# Patient Record
Sex: Female | Born: 1975 | Race: White | Hispanic: No | Marital: Married | Smoking: Never smoker
Health system: Southern US, Community
[De-identification: ages and names within clinical notes are randomized; demographics above are authoritative.]

## PROBLEM LIST (undated history)

## (undated) DIAGNOSIS — R5383 Other fatigue: Secondary | ICD-10-CM

## (undated) DIAGNOSIS — N390 Urinary tract infection, site not specified: Secondary | ICD-10-CM

## (undated) DIAGNOSIS — G43909 Migraine, unspecified, not intractable, without status migrainosus: Secondary | ICD-10-CM

## (undated) DIAGNOSIS — F329 Major depressive disorder, single episode, unspecified: Secondary | ICD-10-CM

## (undated) DIAGNOSIS — N920 Excessive and frequent menstruation with regular cycle: Secondary | ICD-10-CM

## (undated) DIAGNOSIS — F32A Depression, unspecified: Secondary | ICD-10-CM

## (undated) DIAGNOSIS — E559 Vitamin D deficiency, unspecified: Secondary | ICD-10-CM

## (undated) DIAGNOSIS — F419 Anxiety disorder, unspecified: Secondary | ICD-10-CM

## (undated) HISTORY — DX: Depression, unspecified: F32.A

## (undated) HISTORY — DX: Anxiety disorder, unspecified: F41.9

## (undated) HISTORY — DX: Major depressive disorder, single episode, unspecified: F32.9

## (undated) HISTORY — DX: Vitamin D deficiency, unspecified: E55.9

## (undated) HISTORY — PX: WISDOM TOOTH EXTRACTION: SHX21

## (undated) HISTORY — PX: TONSILLECTOMY: SUR1361

## (undated) HISTORY — DX: Excessive and frequent menstruation with regular cycle: N92.0

## (undated) HISTORY — DX: Other fatigue: R53.83

## (undated) HISTORY — DX: Migraine, unspecified, not intractable, without status migrainosus: G43.909

## (undated) HISTORY — DX: Urinary tract infection, site not specified: N39.0

---

## 2012-02-13 ENCOUNTER — Ambulatory Visit: Payer: Self-pay

## 2014-09-28 LAB — HM PAP SMEAR: HM Pap smear: NEGATIVE

## 2015-09-29 ENCOUNTER — Encounter: Payer: Self-pay | Admitting: *Deleted

## 2015-10-04 ENCOUNTER — Ambulatory Visit (INDEPENDENT_AMBULATORY_CARE_PROVIDER_SITE_OTHER): Payer: 59 | Admitting: Obstetrics and Gynecology

## 2015-10-04 ENCOUNTER — Encounter: Payer: Self-pay | Admitting: Obstetrics and Gynecology

## 2015-10-04 VITALS — BP 108/66 | HR 82 | Ht 65.0 in | Wt 157.3 lb

## 2015-10-04 DIAGNOSIS — Z01419 Encounter for gynecological examination (general) (routine) without abnormal findings: Secondary | ICD-10-CM | POA: Diagnosis not present

## 2015-10-04 NOTE — Patient Instructions (Signed)
  Place annual gynecologic exam patient instructions here.  Thank you for enrolling in [facility]. Please follow the instructions below to securely access your online medical record. MyChart allows you to send messages to your doctor, view your test results, manage appointments, and more.   How Do I Sign Up? 1. In your Internet browser, go to [employer] and enter [email or web address]. 2. Click on the Sign Up Now link in the Sign In box. You will see the New Member Sign Up page. 3. Enter your MyChart Access Code exactly as it appears below. You will not need to use this code after you've completed the sign-up process. If you do not sign up before the expiration date, you must request a new code.  MyChart Access Code: [id number] Expires: 10/18/2015  8:57 AM  4. Enter your Social Security Number ([id number]) and Date of Birth (mm/dd/yyyy) as indicated and click Submit. You will be taken to the next sign-up page. 5. Create a MyChart ID. This will be your MyChart login ID and cannot be changed, so think of one that is secure and easy to remember. 6. Create a MyChart password. You can change your password at any time. 7. Enter your Password Reset Question and Answer. This can be used at a later time if you forget your password.  8. Enter your e-mail address. You will receive e-mail notification when new information is available in [facility]. 9. Click Sign Up. You can now view your medical record.   Additional Information Remember, MyChart is NOT to be used for urgent needs. For medical emergencies, dial 911.

## 2015-10-04 NOTE — Progress Notes (Signed)
Subjective:   Marcia Green is a 39 y.o. G68P0 Caucasian female here for a routine well-woman exam.  No LMP recorded. Patient is not currently having periods (Reason: IUD).    Current complaints: none PCP: me       does desire labs  Social History: Sexual: heterosexual Marital Status: co-habitating Living situation: with family Occupation: Oncologist Tobacco/alcohol: no tobacco use Illicit drugs: no history of illicit drug use  The following portions of the patient's history were reviewed and updated as appropriate: allergies, current medications, past family history, past medical history, past social history, past surgical history and problem list.  Past Medical History Past Medical History  Diagnosis Date  . Anxiety   . Depression   . UTI (lower urinary tract infection)   . Heavy periods   . Migraines   . Vitamin D deficiency   . Fatigue     Past Surgical History History reviewed. No pertinent past surgical history.  Gynecologic History G2P0  No LMP recorded. Patient is not currently having periods (Reason: IUD). Contraception: IUD Last Pap: 2015. Results were: normal  Obstetric History OB History  Gravida Para Term Preterm AB SAB TAB Ectopic Multiple Living  2         2    # Outcome Date GA Lbr Len/2nd Weight Sex Delivery Anes PTL Lv  2 Gravida 1996    M Vag-Spont   Y  1 Gravida     M Vag-Spont   Y      Current Medications Current Outpatient Prescriptions on File Prior to Visit  Medication Sig Dispense Refill  . ergocalciferol (VITAMIN D2) 50000 UNITS capsule Take 50,000 Units by mouth once a week.    Marland Kitchen levonorgestrel (MIRENA) 20 MCG/24HR IUD 1 each by Intrauterine route once.     No current facility-administered medications on file prior to visit.    Review of Systems Patient denies any headaches, blurred vision, shortness of breath, chest pain, abdominal pain, problems with bowel movements, urination, or intercourse.  Objective:  BP 108/66 mmHg  Pulse  82  Ht 5\' 5"  (1.651 m)  Wt 157 lb 4.8 oz (71.351 kg)  BMI 26.18 kg/m2 Physical Exam  General:  Well developed, well nourished, no acute distress. She is alert and oriented x3. Skin:  Warm and dry Neck:  Midline trachea, no thyromegaly or nodules Cardiovascular: Regular rate and rhythm, no murmur heard Lungs:  Effort normal, all lung fields clear to auscultation bilaterally Breasts:  No dominant palpable mass, retraction, or nipple discharge Abdomen:  Soft, non tender, no hepatosplenomegaly or masses Pelvic:  External genitalia is normal in appearance.  The vagina is normal in appearance. The cervix is bulbous, no CMT. IUD string noted Thin prep pap is not done  . Uterus is felt to be normal size, shape, and contour.  No adnexal masses or tenderness noted. Extremities:  No swelling or varicosities noted Psych:  She has a normal mood and affect  Assessment:   Healthy well-woman exam Vitamin D def IUD check  Plan:  Labs obtained F/U 1 year for AE, or sooner if needed   Faye Strohman Rockney Ghee, CNM

## 2015-10-05 ENCOUNTER — Other Ambulatory Visit: Payer: Self-pay | Admitting: Obstetrics and Gynecology

## 2015-10-05 DIAGNOSIS — E559 Vitamin D deficiency, unspecified: Secondary | ICD-10-CM | POA: Insufficient documentation

## 2015-10-05 LAB — COMPREHENSIVE METABOLIC PANEL
ALK PHOS: 58 IU/L (ref 39–117)
ALT: 15 IU/L (ref 0–32)
AST: 17 IU/L (ref 0–40)
Albumin/Globulin Ratio: 1.9 (ref 1.1–2.5)
Albumin: 4.8 g/dL (ref 3.5–5.5)
BUN/Creatinine Ratio: 13 (ref 8–20)
BUN: 9 mg/dL (ref 6–20)
Bilirubin Total: 0.7 mg/dL (ref 0.0–1.2)
CALCIUM: 9.5 mg/dL (ref 8.7–10.2)
CO2: 22 mmol/L (ref 18–29)
CREATININE: 0.67 mg/dL (ref 0.57–1.00)
Chloride: 100 mmol/L (ref 96–106)
GFR calc Af Amer: 128 mL/min/{1.73_m2} (ref >59)
GFR, EST NON AFRICAN AMERICAN: 111 mL/min/{1.73_m2} (ref >59)
GLOBULIN, TOTAL: 2.5 g/dL (ref 1.5–4.5)
GLUCOSE: 88 mg/dL (ref 65–99)
Potassium: 3.7 mmol/L (ref 3.5–5.2)
SODIUM: 138 mmol/L (ref 134–144)
Total Protein: 7.3 g/dL (ref 6.0–8.5)

## 2015-10-05 LAB — LIPID PANEL
CHOLESTEROL TOTAL: 170 mg/dL (ref 100–199)
Chol/HDL Ratio: 3 ratio units (ref 0.0–4.4)
HDL: 56 mg/dL (ref >39)
LDL CALC: 101 mg/dL — AB (ref 0–99)
Triglycerides: 63 mg/dL (ref 0–149)
VLDL CHOLESTEROL CAL: 13 mg/dL (ref 5–40)

## 2015-10-05 LAB — VITAMIN D 25 HYDROXY (VIT D DEFICIENCY, FRACTURES): VIT D 25 HYDROXY: 26.1 ng/mL — AB (ref 30.0–100.0)

## 2015-10-05 MED ORDER — ERGOCALCIFEROL 1.25 MG (50000 UT) PO CAPS: 50000.0000 [IU] | ORAL_CAPSULE | ORAL | Status: DC

## 2016-10-04 ENCOUNTER — Encounter: Payer: 59 | Admitting: Obstetrics and Gynecology

## 2016-11-30 ENCOUNTER — Encounter: Payer: Self-pay | Admitting: Obstetrics and Gynecology

## 2016-11-30 ENCOUNTER — Ambulatory Visit (INDEPENDENT_AMBULATORY_CARE_PROVIDER_SITE_OTHER): Payer: 59 | Admitting: Obstetrics and Gynecology

## 2016-11-30 VITALS — BP 112/80 | HR 78 | Ht 65.0 in | Wt 156.4 lb

## 2016-11-30 DIAGNOSIS — Z30431 Encounter for routine checking of intrauterine contraceptive device: Secondary | ICD-10-CM

## 2016-11-30 DIAGNOSIS — Z01419 Encounter for gynecological examination (general) (routine) without abnormal findings: Secondary | ICD-10-CM

## 2016-11-30 NOTE — Progress Notes (Signed)
Subjective:   Marcia Green is a 41 y.o. G32P0 Caucasian female here for a routine well-woman exam.  No LMP recorded. Patient is not currently having periods (Reason: IUD).    Current complaints: none, amenorrhea secondary to IUD PCP: none       does desire labs  Social History: Sexual: heterosexual Marital Status: married Living situation: with family Occupation: [employer] Tobacco/alcohol: no tobacco use Illicit drugs: no history of illicit drug use  The following portions of the patient's history were reviewed and updated as appropriate: allergies, current medications, past family history, past medical history, past social history, past surgical history and problem list.  Past Medical History Past Medical History:  Diagnosis Date  . Anxiety   . Depression   . Fatigue   . Heavy periods   . Migraines   . UTI (lower urinary tract infection)   . Vitamin D deficiency     Past Surgical History History reviewed. No pertinent surgical history.  Gynecologic History G2P0  No LMP recorded. Patient is not currently having periods (Reason: IUD). Contraception: IUD Last Pap: 2015. Results were: normal   Obstetric History OB History  Gravida Para Term Preterm AB Living  2         2  SAB TAB Ectopic Multiple Live Births          2    # Outcome Date GA Lbr Len/2nd Weight Sex Delivery Anes PTL Lv  2 Gravida 1996    M Vag-Spont   LIV  1 Gravida     M Vag-Spont   LIV      Current Medications Current Outpatient Prescriptions on File Prior to Visit  Medication Sig Dispense Refill  . ergocalciferol (VITAMIN D2) 50000 UNITS capsule Take 1 capsule (50,000 Units total) by mouth once a week. 30 capsule 1  . levonorgestrel (MIRENA) 20 MCG/24HR IUD 1 each by Intrauterine route once.     No current facility-administered medications on file prior to visit.     Review of Systems Patient denies any headaches, blurred vision, shortness of breath, chest pain, abdominal pain, problems with  bowel movements, urination, or intercourse.  Objective:  BP 112/80   Pulse 78   Ht 5\' 5"  (1.651 m)   Wt 156 lb 6.4 oz (70.9 kg)   BMI 26.03 kg/m  Physical Exam  General:  Well developed, well nourished, no acute distress. She is alert and oriented x3. Skin:  Warm and dry Neck:  Midline trachea, no thyromegaly or nodules Cardiovascular: Regular rate and rhythm, no murmur heard Lungs:  Effort normal, all lung fields clear to auscultation bilaterally Breasts:  No dominant palpable mass, retraction, or nipple discharge Abdomen:  Soft, non tender, no hepatosplenomegaly or masses Pelvic:  External genitalia is normal in appearance.  The vagina is normal in appearance. The cervix is bulbous, no CMT. IUD string noted  Thin prep pap is not done. Uterus is felt to be normal size, shape, and contour.  No adnexal masses or tenderness noted. Extremities:  No swelling or varicosities noted Psych:  She has a normal mood and affect  Assessment:   Healthy well-woman exam IUD check H/O vit D Deficiency  Plan:  Labs obtained F/U 1 year for AE, or sooner if needed Mammogram ordered  Kepler Mccabe Rockney Ghee, CNM

## 2016-11-30 NOTE — Patient Instructions (Signed)
 Preventive Care 18-39 Years, Female Preventive care refers to lifestyle choices and visits with your health care provider that can promote health and wellness. What does preventive care include?  A yearly physical exam. This is also called an annual well check.  Dental exams once or twice a year.  Routine eye exams. Ask your health care provider how often you should have your eyes checked.  Personal lifestyle choices, including:  Daily care of your teeth and gums.  Regular physical activity.  Eating a healthy diet.  Avoiding tobacco and drug use.  Limiting alcohol use.  Practicing safe sex.  Taking vitamin and mineral supplements as recommended by your health care provider. What happens during an annual well check? The services and screenings done by your health care provider during your annual well check will depend on your age, overall health, lifestyle risk factors, and family history of disease. Counseling  Your health care provider may ask you questions about your:  Alcohol use.  Tobacco use.  Drug use.  Emotional well-being.  Home and relationship well-being.  Sexual activity.  Eating habits.  Work and work environment.  Method of birth control.  Menstrual cycle.  Pregnancy history. Screening  You may have the following tests or measurements:  Height, weight, and BMI.  Diabetes screening. This is done by checking your blood sugar (glucose) after you have not eaten for a while (fasting).  Blood pressure.  Lipid and cholesterol levels. These may be checked every 5 years starting at age 20.  Skin check.  Hepatitis C blood test.  Hepatitis B blood test.  Sexually transmitted disease (STD) testing.  BRCA-related cancer screening. This may be done if you have a family history of breast, ovarian, tubal, or peritoneal cancers.  Pelvic exam and Pap test. This may be done every 3 years starting at age 21. Starting at age 30, this may be done  every 5 years if you have a Pap test in combination with an HPV test. Discuss your test results, treatment options, and if necessary, the need for more tests with your health care provider. Vaccines  Your health care provider may recommend certain vaccines, such as:  Influenza vaccine. This is recommended every year.  Tetanus, diphtheria, and acellular pertussis (Tdap, Td) vaccine. You may need a Td booster every 10 years.  Varicella vaccine. You may need this if you have not been vaccinated.  HPV vaccine. If you are 26 or younger, you may need three doses over 6 months.  Measles, mumps, and rubella (MMR) vaccine. You may need at least one dose of MMR. You may also need a second dose.  Pneumococcal 13-valent conjugate (PCV13) vaccine. You may need this if you have certain conditions and were not previously vaccinated.  Pneumococcal polysaccharide (PPSV23) vaccine. You may need one or two doses if you smoke cigarettes or if you have certain conditions.  Meningococcal vaccine. One dose is recommended if you are age 19-21 years and a first-year college student living in a residence hall, or if you have one of several medical conditions. You may also need additional booster doses.  Hepatitis A vaccine. You may need this if you have certain conditions or if you travel or work in places where you may be exposed to hepatitis A.  Hepatitis B vaccine. You may need this if you have certain conditions or if you travel or work in places where you may be exposed to hepatitis B.  Haemophilus influenzae type b (Hib) vaccine. You may need   this if you have certain risk factors. Talk to your health care provider about which screenings and vaccines you need and how often you need them. This information is not intended to replace advice given to you by your health care provider. Make sure you discuss any questions you have with your health care provider. Document Released: 11/27/2001 Document Revised:  06/20/2016 Document Reviewed: 08/02/2015 Elsevier Interactive Patient Education  2017 [employer]  

## 2016-12-01 LAB — VITAMIN D 25 HYDROXY (VIT D DEFICIENCY, FRACTURES): VIT D 25 HYDROXY: 26.1 ng/mL — AB (ref 30.0–100.0)

## 2016-12-01 LAB — COMPREHENSIVE METABOLIC PANEL
ALK PHOS: 58 IU/L (ref 39–117)
ALT: 13 IU/L (ref 0–32)
AST: 18 IU/L (ref 0–40)
Albumin/Globulin Ratio: 1.7 (ref 1.2–2.2)
Albumin: 4.3 g/dL (ref 3.5–5.5)
BUN/Creatinine Ratio: 15 (ref 9–23)
BUN: 9 mg/dL (ref 6–24)
Bilirubin Total: 0.5 mg/dL (ref 0.0–1.2)
CALCIUM: 9.7 mg/dL (ref 8.7–10.2)
CO2: 19 mmol/L (ref 18–29)
CREATININE: 0.59 mg/dL (ref 0.57–1.00)
Chloride: 102 mmol/L (ref 96–106)
GFR calc Af Amer: 133 mL/min/{1.73_m2} (ref >59)
GFR, EST NON AFRICAN AMERICAN: 115 mL/min/{1.73_m2} (ref >59)
GLOBULIN, TOTAL: 2.5 g/dL (ref 1.5–4.5)
GLUCOSE: 89 mg/dL (ref 65–99)
Potassium: 4.6 mmol/L (ref 3.5–5.2)
SODIUM: 141 mmol/L (ref 134–144)
Total Protein: 6.8 g/dL (ref 6.0–8.5)

## 2016-12-01 LAB — LIPID PANEL
CHOLESTEROL TOTAL: 152 mg/dL (ref 100–199)
Chol/HDL Ratio: 2.9 ratio units (ref 0.0–4.4)
HDL: 52 mg/dL (ref >39)
LDL CALC: 89 mg/dL (ref 0–99)
Triglycerides: 53 mg/dL (ref 0–149)
VLDL Cholesterol Cal: 11 mg/dL (ref 5–40)

## 2016-12-04 ENCOUNTER — Other Ambulatory Visit: Payer: Self-pay | Admitting: Obstetrics and Gynecology

## 2016-12-04 MED ORDER — ERGOCALCIFEROL 1.25 MG (50000 UT) PO CAPS: 50000.0000 [IU] | ORAL_CAPSULE | ORAL | 1 refills | Status: DC

## 2016-12-24 ENCOUNTER — Ambulatory Visit
Admission: RE | Admit: 2016-12-24 | Discharge: 2016-12-24 | Disposition: A | Discharge status: Home / Self Care | Payer: 59 | Source: Ambulatory Visit | Attending: Obstetrics and Gynecology | Admitting: Obstetrics and Gynecology

## 2016-12-24 DIAGNOSIS — Z1231 Encounter for screening mammogram for malignant neoplasm of breast: Secondary | ICD-10-CM | POA: Diagnosis not present

## 2016-12-24 DIAGNOSIS — Z01419 Encounter for gynecological examination (general) (routine) without abnormal findings: Secondary | ICD-10-CM | POA: Diagnosis present

## 2017-06-07 ENCOUNTER — Encounter: Payer: Self-pay | Admitting: Obstetrics and Gynecology

## 2017-06-10 ENCOUNTER — Other Ambulatory Visit: Payer: Self-pay | Admitting: *Deleted

## 2017-06-10 MED ORDER — SCOPOLAMINE 1 MG/3DAYS TD PT72: 1.0000 | MEDICATED_PATCH | TRANSDERMAL | 12 refills | Status: DC

## 2017-06-20 ENCOUNTER — Encounter: Payer: Self-pay | Admitting: Obstetrics and Gynecology

## 2017-06-21 ENCOUNTER — Other Ambulatory Visit: Payer: Self-pay | Admitting: Obstetrics and Gynecology

## 2017-06-21 MED ORDER — CIPROFLOXACIN HCL 500 MG PO TABS
500.0000 mg | ORAL_TABLET | Freq: Two times a day (BID) | ORAL | 1 refills | Status: DC
Start: 2017-06-21 — End: 2017-12-09

## 2017-06-21 MED ORDER — MECLIZINE HCL 32 MG PO TABS
32.0000 mg | ORAL_TABLET | Freq: Three times a day (TID) | ORAL | 1 refills | Status: DC | PRN
Start: 2017-06-21 — End: 2018-12-10

## 2017-12-06 ENCOUNTER — Encounter: Payer: 59 | Admitting: Obstetrics and Gynecology

## 2017-12-09 ENCOUNTER — Ambulatory Visit (INDEPENDENT_AMBULATORY_CARE_PROVIDER_SITE_OTHER): Payer: Managed Care, Other (non HMO) | Admitting: Certified Nurse Midwife

## 2017-12-09 ENCOUNTER — Encounter: Payer: Self-pay | Admitting: Certified Nurse Midwife

## 2017-12-09 VITALS — BP 113/73 | HR 76 | Ht 65.0 in | Wt 172.1 lb

## 2017-12-09 DIAGNOSIS — N898 Other specified noninflammatory disorders of vagina: Secondary | ICD-10-CM | POA: Diagnosis not present

## 2017-12-09 DIAGNOSIS — Z0000 Encounter for general adult medical examination without abnormal findings: Secondary | ICD-10-CM | POA: Diagnosis not present

## 2017-12-09 MED ORDER — CYANOCOBALAMIN 1000 MCG/ML IJ SOLN: 1000.0000 ug | Freq: Once | INTRAMUSCULAR | 0 refills | Status: AC

## 2017-12-09 MED ORDER — CYANOCOBALAMIN 1000 MCG/ML IJ SOLN
1000.0000 ug | Freq: Once | INTRAMUSCULAR | Status: AC
  Administered 2017-12-09: 1000 ug via INTRAMUSCULAR

## 2017-12-09 MED ORDER — CYANOCOBALAMIN 1000 MCG/ML IJ SOLN: 1000.0000 ug | Freq: Once | INTRAMUSCULAR | Status: DC

## 2017-12-09 MED ORDER — PHENTERMINE HCL 37.5 MG PO CAPS: 37.5000 mg | ORAL_CAPSULE | ORAL | 0 refills | Status: DC

## 2017-12-09 NOTE — Progress Notes (Signed)
Pt is here for her annual exam. LPS 2018 WNL. Would like to discuss weight loss.

## 2017-12-09 NOTE — Patient Instructions (Addendum)
Preventive Care 40-64 Years, Female Preventive care refers to lifestyle choices and visits with your health care provider that can promote health and wellness. What does preventive care include?  A yearly physical exam. This is also called an annual well check.  Dental exams once or twice a year.  Routine eye exams. Ask your health care provider how often you should have your eyes checked.  Personal lifestyle choices, including: ? Daily care of your teeth and gums. ? Regular physical activity. ? Eating a healthy diet. ? Avoiding tobacco and drug use. ? Limiting alcohol use. ? Practicing safe sex. ? Taking low-dose aspirin daily starting at age 58. ? Taking vitamin and mineral supplements as recommended by your health care provider. What happens during an annual well check? The services and screenings done by your health care provider during your annual well check will depend on your age, overall health, lifestyle risk factors, and family history of disease. Counseling Your health care provider may ask you questions about your:  Alcohol use.  Tobacco use.  Drug use.  Emotional well-being.  Home and relationship well-being.  Sexual activity.  Eating habits.  Work and work Statistician.  Method of birth control.  Menstrual cycle.  Pregnancy history.  Screening You may have the following tests or measurements:  Height, weight, and BMI.  Blood pressure.  Lipid and cholesterol levels. These may be checked every 5 years, or more frequently if you are over 81 years old.  Skin check.  Lung cancer screening. You may have this screening every year starting at age 78 if you have a 30-pack-year history of smoking and currently smoke or have quit within the past 15 years.  Fecal occult blood test (FOBT) of the stool. You may have this test every year starting at age 65.  Flexible sigmoidoscopy or colonoscopy. You may have a sigmoidoscopy every 5 years or a colonoscopy  every 10 years starting at age 30.  Hepatitis C blood test.  Hepatitis B blood test.  Sexually transmitted disease (STD) testing.  Diabetes screening. This is done by checking your blood sugar (glucose) after you have not eaten for a while (fasting). You may have this done every 1-3 years.  Mammogram. This may be done every 1-2 years. Talk to your health care provider about when you should start having regular mammograms. This may depend on whether you have a family history of breast cancer.  BRCA-related cancer screening. This may be done if you have a family history of breast, ovarian, tubal, or peritoneal cancers.  Pelvic exam and Pap test. This may be done every 3 years starting at age 80. Starting at age 36, this may be done every 5 years if you have a Pap test in combination with an HPV test.  Bone density scan. This is done to screen for osteoporosis. You may have this scan if you are at high risk for osteoporosis.  Discuss your test results, treatment options, and if necessary, the need for more tests with your health care provider. Vaccines Your health care provider may recommend certain vaccines, such as:  Influenza vaccine. This is recommended every year.  Tetanus, diphtheria, and acellular pertussis (Tdap, Td) vaccine. You may need a Td booster every 10 years.  Varicella vaccine. You may need this if you have not been vaccinated.  Zoster vaccine. You may need this after age 5.  Measles, mumps, and rubella (MMR) vaccine. You may need at least one dose of MMR if you were born in  1957 or later. You may also need a second dose.  Pneumococcal 13-valent conjugate (PCV13) vaccine. You may need this if you have certain conditions and were not previously vaccinated.  Pneumococcal polysaccharide (PPSV23) vaccine. You may need one or two doses if you smoke cigarettes or if you have certain conditions.  Meningococcal vaccine. You may need this if you have certain  conditions.  Hepatitis A vaccine. You may need this if you have certain conditions or if you travel or work in places where you may be exposed to hepatitis A.  Hepatitis B vaccine. You may need this if you have certain conditions or if you travel or work in places where you may be exposed to hepatitis B.  Haemophilus influenzae type b (Hib) vaccine. You may need this if you have certain conditions.  Talk to your health care provider about which screenings and vaccines you need and how often you need them. This information is not intended to replace advice given to you by your health care provider. Make sure you discuss any questions you have with your health care provider. Document Released: 10/28/2015 Document Revised: 06/20/2016 Document Reviewed: 08/02/2015 Elsevier Interactive Patient Education  2018 [place] for [employer] Calories are units of energy. Your body needs a certain amount of calories from food to keep you going throughout the day. When you eat more calories than your body needs, your body stores the extra calories as fat. When you eat fewer calories than your body needs, your body burns fat to get the energy it needs. Calorie counting means keeping track of how many calories you eat and drink each day. Calorie counting can be helpful if you need to lose weight. If you make sure to eat fewer calories than your body needs, you should lose weight. Ask your health care provider what a healthy weight is for you. For calorie counting to work, you will need to eat the right number of calories in a day in order to lose a healthy amount of weight per week. A dietitian can help you determine how many calories you need in a day and will give you suggestions on how to reach your calorie goal.  A healthy amount of weight to lose per week is usually 1-2 lb (0.5-0.9 kg). This usually means that your daily calorie intake should be reduced by 500-750 calories.  Eating  1,200 - 1,500 calories per day can help most women lose weight.  Eating 1,500 - 1,800 calories per day can help most men lose weight.  What is my plan? My goal is to have __________ calories per day. If I have this many calories per day, I should lose around __________ pounds per week. What do I need to know about calorie counting? In order to meet your daily calorie goal, you will need to:  Find out how many calories are in each food you would like to eat. Try to do this before you eat.  Decide how much of the food you plan to eat.  Write down what you ate and how many calories it had. Doing this is called keeping a food log.  To successfully lose weight, it is important to balance calorie counting with a healthy lifestyle that includes regular activity. Aim for 150 minutes of moderate exercise (such as walking) or 75 minutes of vigorous exercise (such as running) each week. Where do I find calorie information?  The number of calories in a food can be found on  a Nutrition Facts label. If a food does not have a Nutrition Facts label, try to look up the calories online or ask your dietitian for help. Remember that calories are listed per serving. If you choose to have more than one serving of a food, you will have to multiply the calories per serving by the amount of servings you plan to eat. For example, the label on a package of bread might say that a serving size is 1 slice and that there are 90 calories in a serving. If you eat 1 slice, you will have eaten 90 calories. If you eat 2 slices, you will have eaten 180 calories. How do I keep a food log? Immediately after each meal, record the following information in your food log:  What you ate. Don't forget to include toppings, sauces, and other extras on the food.  How much you ate. This can be measured in cups, ounces, or number of items.  How many calories each food and drink had.  The total number of calories in the meal.  Keep  your food log near you, such as in a small notebook in your pocket, or use a mobile app or website. Some programs will calculate calories for you and show you how many calories you have left for the day to meet your goal. What are some calorie counting tips?  Use your calories on foods and drinks that will fill you up and not leave you hungry: ? Some examples of foods that fill you up are nuts and nut butters, vegetables, lean proteins, and high-fiber foods like whole grains. High-fiber foods are foods with more than 5 g fiber per serving. ? Drinks such as sodas, specialty coffee drinks, alcohol, and juices have a lot of calories, yet do not fill you up.  Eat nutritious foods and avoid empty calories. Empty calories are calories you get from foods or beverages that do not have many vitamins or protein, such as candy, sweets, and soda. It is better to have a nutritious high-calorie food (such as an avocado) than a food with few nutrients (such as a bag of chips).  Know how many calories are in the foods you eat most often. This will help you calculate calorie counts faster.  Pay attention to calories in drinks. Low-calorie drinks include water and unsweetened drinks.  Pay attention to nutrition labels for "low fat" or "fat free" foods. These foods sometimes have the same amount of calories or more calories than the full fat versions. They also often have added sugar, starch, or salt, to make up for flavor that was removed with the fat.  Find a way of tracking calories that works for you. Get creative. Try different apps or programs if writing down calories does not work for you. What are some portion control tips?  Know how many calories are in a serving. This will help you know how many servings of a certain food you can have.  Use a measuring cup to measure serving sizes. You could also try weighing out portions on a kitchen scale. With time, you will be able to estimate serving sizes for some  foods.  Take some time to put servings of different foods on your favorite plates, bowls, and cups so you know what a serving looks like.  Try not to eat straight from a bag or box. Doing this can lead to overeating. Put the amount you would like to eat in a cup or on a plate  to make sure you are eating the right portion.  Use smaller plates, glasses, and bowls to prevent overeating.  Try not to multitask (for example, watch TV or use your computer) while eating. If it is time to eat, sit down at a table and enjoy your food. This will help you to know when you are full. It will also help you to be aware of what you are eating and how much you are eating. What are tips for following this plan? Reading food labels  Check the calorie count compared to the serving size. The serving size may be smaller than what you are used to eating.  Check the source of the calories. Make sure the food you are eating is high in vitamins and protein and low in saturated and trans fats. Shopping  Read nutrition labels while you shop. This will help you make healthy decisions before you decide to purchase your food.  Make a grocery list and stick to it. Cooking  Try to cook your favorite foods in a healthier way. For example, try baking instead of frying.  Use low-fat dairy products. Meal planning  Use more fruits and vegetables. Half of your plate should be fruits and vegetables.  Include lean proteins like poultry and fish. How do I count calories when eating out?  Ask for smaller portion sizes.  Consider sharing an entree and sides instead of getting your own entree.  If you get your own entree, eat only half. Ask for a box at the beginning of your meal and put the rest of your entree in it so you are not tempted to eat it.  If calories are listed on the menu, choose the lower calorie options.  Choose dishes that include vegetables, fruits, whole grains, low-fat dairy products, and lean  protein.  Choose items that are boiled, broiled, grilled, or steamed. Stay away from items that are buttered, battered, fried, or served with cream sauce. Items labeled "crispy" are usually fried, unless stated otherwise.  Choose water, low-fat milk, unsweetened iced tea, or other drinks without added sugar. If you want an alcoholic beverage, choose a lower calorie option such as a glass of wine or light beer.  Ask for dressings, sauces, and syrups on the side. These are usually high in calories, so you should limit the amount you eat.  If you want a salad, choose a garden salad and ask for grilled meats. Avoid extra toppings like bacon, cheese, or fried items. Ask for the dressing on the side, or ask for olive oil and vinegar or lemon to use as dressing.  Estimate how many servings of a food you are given. For example, a serving of cooked rice is  cup or about the size of half a baseball. Knowing serving sizes will help you be aware of how much food you are eating at restaurants. The list below tells you how big or small some common portion sizes are based on everyday objects: ? 1 oz-4 stacked dice. ? 3 oz-1 deck of cards. ? 1 tsp-1 die. ? 1 Tbsp- a ping-pong ball. ? 2 Tbsp-1 ping-pong ball. ?  cup- baseball. ? 1 cup-1 baseball. Summary  Calorie counting means keeping track of how many calories you eat and drink each day. If you eat fewer calories than your body needs, you should lose weight.  A healthy amount of weight to lose per week is usually 1-2 lb (0.5-0.9 kg). This usually means reducing your daily calorie intake by 500-750  calories.  The number of calories in a food can be found on a Nutrition Facts label. If a food does not have a Nutrition Facts label, try to look up the calories online or ask your dietitian for help.  Use your calories on foods and drinks that will fill you up, and not on foods and drinks that will leave you hungry.  Use smaller plates, glasses, and bowls  to prevent overeating. This information is not intended to replace advice given to you by your health care provider. Make sure you discuss any questions you have with your health care provider. Document Released: 10/01/2005 Document Revised: 08/31/2016 Document Reviewed: 08/31/2016 Elsevier Interactive Patient Education  [employer].

## 2017-12-09 NOTE — Progress Notes (Signed)
GYNECOLOGY ANNUAL PREVENTATIVE CARE ENCOUNTER NOTE  Subjective:   Marcia Green is a 42 y.o. G2P0 female here for a routine annual gynecologic exam.  Current complaints: weight gain. She states that last year Marcia Green had encouraged her to lose weight. She has been exercising 3 times a week and has modified her diet but has little success. She is interested in starting weight management with medication assistance.    Denies abnormal vaginal bleeding, discharge, pelvic pain, problems with intercourse or other gynecologic concerns.    Gynecologic History No LMP recorded. Patient is not currently having periods (Reason: IUD). Contraception: IUD Last Pap: 09/25/2014 Results were: normal Last mammogram: 12/24/16. Results were: normal  Obstetric History OB History  Gravida Para Term Preterm AB Living  2         2  SAB TAB Ectopic Multiple Live Births          2    # Outcome Date GA Lbr Len/2nd Weight Sex Delivery Anes PTL Lv  2 Gravida 1996    M Vag-Spont   LIV  1 Gravida     M Vag-Spont   LIV      Past Medical History:  Diagnosis Date  . Anxiety   . Depression   . Fatigue   . Heavy periods   . Migraines   . UTI (lower urinary tract infection)   . Vitamin D deficiency     History reviewed. No pertinent surgical history.  Current Outpatient Medications on File Prior to Visit  Medication Sig Dispense Refill  . cetirizine (ZYRTEC) 10 MG tablet Take by mouth.    . ergocalciferol (VITAMIN D2) 50000 units capsule Take 1 capsule (50,000 Units total) by mouth once a week. 30 capsule 1  . fluticasone (FLONASE) 50 MCG/ACT nasal spray Place into the nose.    . levonorgestrel (MIRENA) 20 MCG/24HR IUD 1 each by Intrauterine route once.    . meclizine (ANTIVERT) 32 MG tablet Take 1 tablet (32 mg total) by mouth 3 (three) times daily as needed. 30 tablet 1  . scopolamine (TRANSDERM-SCOP, 1.5 MG,) 1 MG/3DAYS Place 1 patch (1.5 mg total) onto the skin every 3 (three) days. 10 patch 12   No  current facility-administered medications on file prior to visit.     No Known Allergies  Social History   Socioeconomic History  . Marital status: Married    Spouse name: Not on file  . Number of children: Not on file  . Years of education: Not on file  . Highest education level: Not on file  Social Needs  . Financial resource strain: Not on file  . Food insecurity - worry: Not on file  . Food insecurity - inability: Not on file  . Transportation needs - medical: Not on file  . Transportation needs - non-medical: Not on file  Occupational History  . Not on file  Tobacco Use  . Smoking status: Never Smoker  . Smokeless tobacco: Never Used  Substance and Sexual Activity  . Alcohol use: Yes    Comment: occas  . Drug use: No  . Sexual activity: Yes    Birth control/protection: IUD    Comment: mirena  Other Topics Concern  . Not on file  Social History Narrative  . Not on file    Family History  Problem Relation Age of Onset  . Diabetes Brother     The following portions of the patient's history were reviewed and updated as appropriate: allergies, current medications, past family  history, past medical history, past social history, past surgical history and problem list.  Review of Systems Pertinent items noted in HPI and remainder of comprehensive ROS otherwise negative.   Objective:  BP 113/73   Pulse 76   Ht 5\' 5"  (1.651 m)   Wt 172 lb 1 oz (78 kg)   BMI 28.63 kg/m  CONSTITUTIONAL: Well-developed, well-nourished female in no acute distress.  HENT:  Normocephalic, atraumatic, External right and left ear normal. Oropharynx is clear and moist EYES: Conjunctivae and EOM are normal. Pupils are equal, round, and reactive to light. No scleral icterus.  NECK: Normal range of motion, supple, no masses.  Normal thyroid.  SKIN: Skin is warm and dry. No rash noted. Not diaphoretic. No erythema. No pallor. NEUROLOGIC: Alert and oriented to person, place, and time. Normal  reflexes, muscle tone coordination. No cranial nerve deficit noted. PSYCHIATRIC: Normal mood and affect. Normal behavior. Normal judgment and thought content. CARDIOVASCULAR: Normal heart rate noted, regular rhythm RESPIRATORY: Clear to auscultation bilaterally. Effort and breath sounds normal, no problems with respiration noted. BREASTS: Symmetric in size. No masses, skin changes, nipple drainage, or lymphadenopathy. ABDOMEN: Soft, normal bowel sounds, no distention noted.  No tenderness, rebound or guarding.  PELVIC: Normal appearing external genitalia; normal appearing vaginal mucosa and cervix. . Increase white discharge noted.Pt is open  Pap smear obtained.  Normal uterine size, no other palpable masses, no uterine or adnexal tenderness. MUSCULOSKELETAL: Normal range of motion. No tenderness.  No cyanosis, clubbing, or edema.  2+ distal pulses.   Assessment and Plan:  Annual Well Women Exam  Will follow up results of pap smear and manage accordingly. Mammogram scheduled Pt has increased vaginal discharge and request gc/chlamydia testing Weight loss medications: phentermine and vitamin B 12 injections. 1st injections today. Discussed keto diet vs.  Calorie counting with goal 1200-1500 calories per day.  Routine preventative health maintenance measures emphasized. Please refer to After Visit Summary for other counseling recommendations.    Philip Aspen, CNM  [facility]

## 2017-12-09 NOTE — Addendum Note (Signed)
Addended by: Raliegh Ip on: 12/09/2017 03:59 PM   Modules accepted: Orders

## 2017-12-10 LAB — GC/CHLAMYDIA PROBE AMP
CHLAMYDIA, DNA PROBE: NEGATIVE
Neisseria gonorrhoeae by PCR: NEGATIVE

## 2017-12-11 ENCOUNTER — Encounter: Payer: Self-pay | Admitting: Certified Nurse Midwife

## 2017-12-11 LAB — PAP IG AND HPV HIGH-RISK
HPV, high-risk: NEGATIVE
PAP Smear Comment: 0

## 2018-01-06 ENCOUNTER — Other Ambulatory Visit: Payer: Self-pay | Admitting: Certified Nurse Midwife

## 2018-01-06 ENCOUNTER — Ambulatory Visit (INDEPENDENT_AMBULATORY_CARE_PROVIDER_SITE_OTHER): Payer: Managed Care, Other (non HMO) | Admitting: Obstetrics and Gynecology

## 2018-01-06 ENCOUNTER — Encounter: Payer: Self-pay | Admitting: Certified Nurse Midwife

## 2018-01-06 ENCOUNTER — Encounter: Payer: Self-pay | Admitting: Obstetrics and Gynecology

## 2018-01-06 VITALS — BP 116/77 | HR 81 | Wt 160.3 lb

## 2018-01-06 DIAGNOSIS — E663 Overweight: Secondary | ICD-10-CM | POA: Diagnosis not present

## 2018-01-06 MED ORDER — CYANOCOBALAMIN 1000 MCG/ML IJ SOLN
1000.0000 ug | Freq: Once | INTRAMUSCULAR | Status: AC
  Administered 2018-01-06: 1000 ug via INTRAMUSCULAR

## 2018-01-06 NOTE — Progress Notes (Signed)
Pt is here for wt, bp check, b-12 inj She is doing well, very proud of her weight loss  01/06/18 wt- 160.3lb 12/09/17 wt- 172lb

## 2018-01-07 ENCOUNTER — Other Ambulatory Visit: Payer: Self-pay

## 2018-01-07 ENCOUNTER — Other Ambulatory Visit: Payer: Self-pay | Admitting: Certified Nurse Midwife

## 2018-01-07 MED ORDER — PHENTERMINE HCL 37.5 MG PO CAPS: 37.5000 mg | ORAL_CAPSULE | ORAL | 1 refills | Status: DC

## 2018-01-07 MED ORDER — CYANOCOBALAMIN 1000 MCG/ML IJ SOLN: 1000.0000 ug | INTRAMUSCULAR | 3 refills | Status: DC

## 2018-02-03 ENCOUNTER — Ambulatory Visit (INDEPENDENT_AMBULATORY_CARE_PROVIDER_SITE_OTHER): Payer: Managed Care, Other (non HMO) | Admitting: Obstetrics and Gynecology

## 2018-02-03 VITALS — BP 128/83 | HR 85 | Wt 161.7 lb

## 2018-02-03 DIAGNOSIS — E663 Overweight: Secondary | ICD-10-CM

## 2018-02-03 MED ORDER — CYANOCOBALAMIN 1000 MCG/ML IJ SOLN
1000.0000 ug | Freq: Once | INTRAMUSCULAR | Status: AC
  Administered 2018-02-03: 1000 ug via INTRAMUSCULAR

## 2018-02-03 NOTE — Progress Notes (Signed)
Pt is here for wt, bp check, b-12 inj She is doing well, did not lose any weight this month states she has been sick, is trying to get back on track  02/03/18 wt- 161.5lb 01/06/18 wt- 160lb

## 2018-03-03 ENCOUNTER — Encounter: Payer: Self-pay | Admitting: Obstetrics and Gynecology

## 2018-03-03 ENCOUNTER — Ambulatory Visit (INDEPENDENT_AMBULATORY_CARE_PROVIDER_SITE_OTHER): Payer: Managed Care, Other (non HMO) | Admitting: Obstetrics and Gynecology

## 2018-03-03 VITALS — BP 119/75 | HR 80 | Wt 156.7 lb

## 2018-03-03 DIAGNOSIS — E663 Overweight: Secondary | ICD-10-CM

## 2018-03-03 MED ORDER — CYANOCOBALAMIN 1000 MCG/ML IJ SOLN
1000.0000 ug | Freq: Once | INTRAMUSCULAR | Status: AC
  Administered 2018-03-03: 1000 ug via INTRAMUSCULAR

## 2018-03-03 NOTE — Progress Notes (Signed)
Pt is here for wt, bp check, b-12 inj She is doing well, denies any s/e  03/03/18 wt- 156.7lb 02/03/18 wt- 161lb

## 2018-03-04 ENCOUNTER — Ambulatory Visit
Admission: RE | Admit: 2018-03-04 | Discharge: 2018-03-04 | Disposition: A | Discharge status: Home / Self Care | Payer: Managed Care, Other (non HMO) | Source: Ambulatory Visit | Attending: Certified Nurse Midwife | Admitting: Certified Nurse Midwife

## 2018-03-04 DIAGNOSIS — Z0000 Encounter for general adult medical examination without abnormal findings: Secondary | ICD-10-CM | POA: Diagnosis not present

## 2018-03-05 ENCOUNTER — Encounter (INDEPENDENT_AMBULATORY_CARE_PROVIDER_SITE_OTHER): Payer: Self-pay

## 2018-03-08 ENCOUNTER — Other Ambulatory Visit: Payer: Self-pay | Admitting: Certified Nurse Midwife

## 2018-03-12 ENCOUNTER — Other Ambulatory Visit: Payer: Self-pay

## 2018-03-12 ENCOUNTER — Encounter: Payer: Self-pay | Admitting: Certified Nurse Midwife

## 2018-03-12 MED ORDER — PHENTERMINE HCL 37.5 MG PO CAPS: 37.5000 mg | ORAL_CAPSULE | ORAL | 1 refills | Status: DC

## 2018-03-12 NOTE — Telephone Encounter (Signed)
Script printed and up front waiting for pickup.

## 2018-03-31 ENCOUNTER — Ambulatory Visit: Payer: Managed Care, Other (non HMO)

## 2018-03-31 ENCOUNTER — Telehealth: Payer: Self-pay | Admitting: Certified Nurse Midwife

## 2018-03-31 NOTE — Telephone Encounter (Signed)
She does not to be on it any longer then.   Thanks,  Deneise Lever

## 2018-03-31 NOTE — Telephone Encounter (Signed)
Her appointment was cancelled for her BP/B12/wt mgmt with [name] for Monday, 04/07/18; and the patient stated she saw Deneise Lever for her annual and when she saw her she stated Deneise Lever wrote her phentermine script; she is requesting to see Deneise Lever again for her prescription due to it running out on 04/09/18.  I informed the patient we are not to reschedule [name]'s patients at this time but I would send back a message and ask.  Her call back number is [phone], and the patient also has an active MyChart.  Please advise, thanks.

## 2018-03-31 NOTE — Telephone Encounter (Signed)
I can see this patient for weight management for [name] but if she does not meet criteria for the medication then I will not prescribe it for her.   Thanks,  Philip Aspen, CNM

## 2018-04-07 ENCOUNTER — Ambulatory Visit: Payer: Managed Care, Other (non HMO) | Admitting: Obstetrics and Gynecology

## 2018-12-10 ENCOUNTER — Encounter: Payer: Managed Care, Other (non HMO) | Admitting: Certified Nurse Midwife

## 2018-12-10 ENCOUNTER — Encounter: Payer: Self-pay | Admitting: Certified Nurse Midwife

## 2018-12-10 ENCOUNTER — Other Ambulatory Visit: Payer: Self-pay

## 2018-12-10 ENCOUNTER — Ambulatory Visit (INDEPENDENT_AMBULATORY_CARE_PROVIDER_SITE_OTHER): Payer: Managed Care, Other (non HMO) | Admitting: Certified Nurse Midwife

## 2018-12-10 VITALS — BP 122/83 | HR 87 | Ht 65.0 in | Wt 163.4 lb

## 2018-12-10 DIAGNOSIS — Z01419 Encounter for gynecological examination (general) (routine) without abnormal findings: Secondary | ICD-10-CM

## 2018-12-10 DIAGNOSIS — Z1239 Encounter for other screening for malignant neoplasm of breast: Secondary | ICD-10-CM

## 2018-12-10 DIAGNOSIS — Z713 Dietary counseling and surveillance: Secondary | ICD-10-CM | POA: Diagnosis not present

## 2018-12-10 MED ORDER — CYANOCOBALAMIN 1000 MCG/ML IJ SOLN
1000.0000 ug | Freq: Once | INTRAMUSCULAR | Status: AC
  Administered 2018-12-10: 1000 ug via INTRAMUSCULAR

## 2018-12-10 MED ORDER — PHENTERMINE HCL 37.5 MG PO CAPS: 37.5000 mg | ORAL_CAPSULE | ORAL | 0 refills | Status: DC

## 2018-12-10 MED ORDER — SCOPOLAMINE 1 MG/3DAYS TD PT72: 1.0000 | MEDICATED_PATCH | TRANSDERMAL | 12 refills | Status: DC

## 2018-12-10 MED ORDER — SERTRALINE HCL 50 MG PO TABS: 50.0000 mg | ORAL_TABLET | Freq: Every day | ORAL | 1 refills | Status: DC

## 2018-12-10 MED ORDER — PHENTERMINE HCL 37.5 MG PO CAPS: 37.5000 mg | ORAL_CAPSULE | ORAL | 1 refills | Status: DC

## 2018-12-10 MED ORDER — CYANOCOBALAMIN 1000 MCG/ML IJ SOLN: 1000.0000 ug | INTRAMUSCULAR | 3 refills | Status: DC

## 2018-12-10 NOTE — Patient Instructions (Addendum)
Preventive Care 43-64 Years, Female Preventive care refers to lifestyle choices and visits with your health care provider that can promote health and wellness. What does preventive care include?   A yearly physical exam. This is also called an annual well check.  Dental exams once or twice a year.  Routine eye exams. Ask your health care provider how often you should have your eyes checked.  Personal lifestyle choices, including: ? Daily care of your teeth and gums. ? Regular physical activity. ? Eating a healthy diet. ? Avoiding tobacco and drug use. ? Limiting alcohol use. ? Practicing safe sex. ? Taking low-dose aspirin daily starting at age 50. ? Taking vitamin and mineral supplements as recommended by your health care provider. What happens during an annual well check? The services and screenings done by your health care provider during your annual well check will depend on your age, overall health, lifestyle risk factors, and family history of disease. Counseling Your health care provider may ask you questions about your:  Alcohol use.  Tobacco use.  Drug use.  Emotional well-being.  Home and relationship well-being.  Sexual activity.  Eating habits.  Work and work environment.  Method of birth control.  Menstrual cycle.  Pregnancy history. Screening You may have the following tests or measurements:  Height, weight, and BMI.  Blood pressure.  Lipid and cholesterol levels. These may be checked every 5 years, or more frequently if you are over 50 years old.  Skin check.  Lung cancer screening. You may have this screening every year starting at age 55 if you have a 30-pack-year history of smoking and currently smoke or have quit within the past 15 years.  Colorectal cancer screening. All adults should have this screening starting at age 50 and continuing until age 75. Your health care provider may recommend screening at age 45. You will have tests every  1-10 years, depending on your results and the type of screening test. People at increased risk should start screening at an earlier age. Screening tests may include: ? Guaiac-based fecal occult blood testing. ? Fecal immunochemical test (FIT). ? Stool DNA test. ? Virtual colonoscopy. ? Sigmoidoscopy. During this test, a flexible tube with a tiny camera (sigmoidoscope) is used to examine your rectum and lower colon. The sigmoidoscope is inserted through your anus into your rectum and lower colon. ? Colonoscopy. During this test, a long, thin, flexible tube with a tiny camera (colonoscope) is used to examine your entire colon and rectum.  Hepatitis C blood test.  Hepatitis B blood test.  Sexually transmitted disease (STD) testing.  Diabetes screening. This is done by checking your blood sugar (glucose) after you have not eaten for a while (fasting). You may have this done every 1-3 years.  Mammogram. This may be done every 1-2 years. Talk to your health care provider about when you should start having regular mammograms. This may depend on whether you have a family history of breast cancer.  BRCA-related cancer screening. This may be done if you have a family history of breast, ovarian, tubal, or peritoneal cancers.  Pelvic exam and Pap test. This may be done every 3 years starting at age 21. Starting at age 30, this may be done every 5 years if you have a Pap test in combination with an HPV test.  Bone density scan. This is done to screen for osteoporosis. You may have this scan if you are at high risk for osteoporosis. Discuss your test results, treatment options,   and if necessary, the need for more tests with your health care provider. Vaccines Your health care provider may recommend certain vaccines, such as:  Influenza vaccine. This is recommended every year.  Tetanus, diphtheria, and acellular pertussis (Tdap, Td) vaccine. You may need a Td booster every 10 years.  Varicella  vaccine. You may need this if you have not been vaccinated.  Zoster vaccine. You may need this after age 4.  Measles, mumps, and rubella (MMR) vaccine. You may need at least one dose of MMR if you were born in 1957 or later. You may also need a second dose.  Pneumococcal 13-valent conjugate (PCV13) vaccine. You may need this if you have certain conditions and were not previously vaccinated.  Pneumococcal polysaccharide (PPSV23) vaccine. You may need one or two doses if you smoke cigarettes or if you have certain conditions.  Meningococcal vaccine. You may need this if you have certain conditions.  Hepatitis A vaccine. You may need this if you have certain conditions or if you travel or work in places where you may be exposed to hepatitis A.  Hepatitis B vaccine. You may need this if you have certain conditions or if you travel or work in places where you may be exposed to hepatitis B.  Haemophilus influenzae type b (Hib) vaccine. You may need this if you have certain conditions. Talk to your health care provider about which screenings and vaccines you need and how often you need them. This information is not intended to replace advice given to you by your health care provider. Make sure you discuss any questions you have with your health care provider. Document Released: 10/28/2015 Document Revised: 11/21/2017 Document Reviewed: 08/02/2015 Elsevier Interactive Patient Education  2019 [place] for [employer] Calories are units of energy. Your body needs a certain amount of calories from food to keep you going throughout the day. When you eat more calories than your body needs, your body stores the extra calories as fat. When you eat fewer calories than your body needs, your body burns fat to get the energy it needs. Calorie counting means keeping track of how many calories you eat and drink each day. Calorie counting can be helpful if you need to lose weight. If you  make sure to eat fewer calories than your body needs, you should lose weight. Ask your health care provider what a healthy weight is for you. For calorie counting to work, you will need to eat the right number of calories in a day in order to lose a healthy amount of weight per week. A dietitian can help you determine how many calories you need in a day and will give you suggestions on how to reach your calorie goal.  A healthy amount of weight to lose per week is usually 1-2 lb (0.5-0.9 kg). This usually means that your daily calorie intake should be reduced by 500-750 calories.  Eating 1,200 - 1,500 calories per day can help most women lose weight.  Eating 1,500 - 1,800 calories per day can help most men lose weight. What is my plan? My goal is to have __________ calories per day. If I have this many calories per day, I should lose around __________ pounds per week. What do I need to know about calorie counting? In order to meet your daily calorie goal, you will need to:  Find out how many calories are in each food you would like to eat. Try to do this before  you eat.  Decide how much of the food you plan to eat.  Write down what you ate and how many calories it had. Doing this is called keeping a food log. To successfully lose weight, it is important to balance calorie counting with a healthy lifestyle that includes regular activity. Aim for 150 minutes of moderate exercise (such as walking) or 75 minutes of vigorous exercise (such as running) each week. Where do I find calorie information?  The number of calories in a food can be found on a Nutrition Facts label. If a food does not have a Nutrition Facts label, try to look up the calories online or ask your dietitian for help. Remember that calories are listed per serving. If you choose to have more than one serving of a food, you will have to multiply the calories per serving by the amount of servings you plan to eat. For example, the  label on a package of bread might say that a serving size is 1 slice and that there are 90 calories in a serving. If you eat 1 slice, you will have eaten 90 calories. If you eat 2 slices, you will have eaten 180 calories. How do I keep a food log? Immediately after each meal, record the following information in your food log:  What you ate. Don't forget to include toppings, sauces, and other extras on the food.  How much you ate. This can be measured in cups, ounces, or number of items.  How many calories each food and drink had.  The total number of calories in the meal. Keep your food log near you, such as in a small notebook in your pocket, or use a mobile app or website. Some programs will calculate calories for you and show you how many calories you have left for the day to meet your goal. What are some calorie counting tips?   Use your calories on foods and drinks that will fill you up and not leave you hungry: ? Some examples of foods that fill you up are nuts and nut butters, vegetables, lean proteins, and high-fiber foods like whole grains. High-fiber foods are foods with more than 5 g fiber per serving. ? Drinks such as sodas, specialty coffee drinks, alcohol, and juices have a lot of calories, yet do not fill you up.  Eat nutritious foods and avoid empty calories. Empty calories are calories you get from foods or beverages that do not have many vitamins or protein, such as candy, sweets, and soda. It is better to have a nutritious high-calorie food (such as an avocado) than a food with few nutrients (such as a bag of chips).  Know how many calories are in the foods you eat most often. This will help you calculate calorie counts faster.  Pay attention to calories in drinks. Low-calorie drinks include water and unsweetened drinks.  Pay attention to nutrition labels for "low fat" or "fat free" foods. These foods sometimes have the same amount of calories or more calories than the  full fat versions. They also often have added sugar, starch, or salt, to make up for flavor that was removed with the fat.  Find a way of tracking calories that works for you. Get creative. Try different apps or programs if writing down calories does not work for you. What are some portion control tips?  Know how many calories are in a serving. This will help you know how many servings of a certain food you can  have.  Use a measuring cup to measure serving sizes. You could also try weighing out portions on a kitchen scale. With time, you will be able to estimate serving sizes for some foods.  Take some time to put servings of different foods on your favorite plates, bowls, and cups so you know what a serving looks like.  Try not to eat straight from a bag or box. Doing this can lead to overeating. Put the amount you would like to eat in a cup or on a plate to make sure you are eating the right portion.  Use smaller plates, glasses, and bowls to prevent overeating.  Try not to multitask (for example, watch TV or use your computer) while eating. If it is time to eat, sit down at a table and enjoy your food. This will help you to know when you are full. It will also help you to be aware of what you are eating and how much you are eating. What are tips for following this plan? Reading food labels  Check the calorie count compared to the serving size. The serving size may be smaller than what you are used to eating.  Check the source of the calories. Make sure the food you are eating is high in vitamins and protein and low in saturated and trans fats. Shopping  Read nutrition labels while you shop. This will help you make healthy decisions before you decide to purchase your food.  Make a grocery list and stick to it. Cooking  Try to cook your favorite foods in a healthier way. For example, try baking instead of frying.  Use low-fat dairy products. Meal planning  Use more fruits and  vegetables. Half of your plate should be fruits and vegetables.  Include lean proteins like poultry and fish. How do I count calories when eating out?  Ask for smaller portion sizes.  Consider sharing an entree and sides instead of getting your own entree.  If you get your own entree, eat only half. Ask for a box at the beginning of your meal and put the rest of your entree in it so you are not tempted to eat it.  If calories are listed on the menu, choose the lower calorie options.  Choose dishes that include vegetables, fruits, whole grains, low-fat dairy products, and lean protein.  Choose items that are boiled, broiled, grilled, or steamed. Stay away from items that are buttered, battered, fried, or served with cream sauce. Items labeled "crispy" are usually fried, unless stated otherwise.  Choose water, low-fat milk, unsweetened iced tea, or other drinks without added sugar. If you want an alcoholic beverage, choose a lower calorie option such as a glass of wine or light beer.  Ask for dressings, sauces, and syrups on the side. These are usually high in calories, so you should limit the amount you eat.  If you want a salad, choose a garden salad and ask for grilled meats. Avoid extra toppings like bacon, cheese, or fried items. Ask for the dressing on the side, or ask for olive oil and vinegar or lemon to use as dressing.  Estimate how many servings of a food you are given. For example, a serving of cooked rice is  cup or about the size of half a baseball. Knowing serving sizes will help you be aware of how much food you are eating at restaurants. The list below tells you how big or small some common portion sizes are based on everyday  objects: ? 1 oz-4 stacked dice. ? 3 oz-1 deck of cards. ? 1 tsp-1 die. ? 1 Tbsp- a ping-pong ball. ? 2 Tbsp-1 ping-pong ball. ?  cup- baseball. ? 1 cup-1 baseball. Summary  Calorie counting means keeping track of how many calories you eat and  drink each day. If you eat fewer calories than your body needs, you should lose weight.  A healthy amount of weight to lose per week is usually 1-2 lb (0.5-0.9 kg). This usually means reducing your daily calorie intake by 500-750 calories.  The number of calories in a food can be found on a Nutrition Facts label. If a food does not have a Nutrition Facts label, try to look up the calories online or ask your dietitian for help.  Use your calories on foods and drinks that will fill you up, and not on foods and drinks that will leave you hungry.  Use smaller plates, glasses, and bowls to prevent overeating. This information is not intended to replace advice given to you by your health care provider. Make sure you discuss any questions you have with your health care provider. Document Released: 10/01/2005 Document Revised: 06/20/2018 Document Reviewed: 08/31/2016 Elsevier Interactive Patient Education  2019 [employer].

## 2018-12-10 NOTE — Progress Notes (Addendum)
GYNECOLOGY ANNUAL PREVENTATIVE CARE ENCOUNTER NOTE  History:     Marcia Green is a 43 y.o. G2P0 female here for a routine annual gynecologic exam.  Current complaints: anxiety and depression.She has had some recent life changes ( her 2 sons have moved out. One went into [employer], other moved out). She has gained some weight back and would like to start weight loss plan again.    Denies abnormal vaginal bleeding, discharge, pelvic pain, problems with intercourse or other gynecologic concerns.    Gynecologic History No LMP recorded. (Menstrual status: IUD). Contraception: IUD Last Pap: 12/09/17. Results were: normal with negative HPV Last mammogram: 03/04/18. Results were: normal  Obstetric History OB History  Gravida Para Term Preterm AB Living  2         2  SAB TAB Ectopic Multiple Live Births          2    # Outcome Date GA Lbr Len/2nd Weight Sex Delivery Anes PTL Lv  2 Gravida 1996    M Vag-Spont   LIV  1 Gravida     M Vag-Spont   LIV    Past Medical History:  Diagnosis Date  . Anxiety   . Depression   . Fatigue   . Heavy periods   . Migraines   . UTI (lower urinary tract infection)   . Vitamin D deficiency     History reviewed. No pertinent surgical history.  Current Outpatient Medications on File Prior to Visit  Medication Sig Dispense Refill  . cetirizine (ZYRTEC) 10 MG tablet Take by mouth.    . ergocalciferol (VITAMIN D2) 50000 units capsule Take 1 capsule (50,000 Units total) by mouth once a week. 30 capsule 1  . fluticasone (FLONASE) 50 MCG/ACT nasal spray Place into the nose.    . levonorgestrel (MIRENA) 20 MCG/24HR IUD 1 each by Intrauterine route once.    Marland Kitchen olopatadine (PATANOL) 0.1 % ophthalmic solution INSTILL 1 DROP INTO BOTH EYES TWICE A DAY    . phentermine 37.5 MG capsule Take 1 capsule (37.5 mg total) by mouth every morning. 30 capsule 1  . cyanocobalamin (,VITAMIN B-12,) 1000 MCG/ML injection Inject 1 mL (1,000 mcg total) into the muscle every 30  (thirty) days. (Patient not taking: Reported on 12/10/2018) 3 mL 3  . scopolamine (TRANSDERM-SCOP, 1.5 MG,) 1 MG/3DAYS Place 1 patch (1.5 mg total) onto the skin every 3 (three) days. (Patient not taking: Reported on 01/06/2018) 10 patch 12   Current Facility-Administered Medications on File Prior to Visit  Medication Dose Route Frequency Provider Last Rate Last Dose  . cyanocobalamin ((VITAMIN B-12)) injection 1,000 mcg  1,000 mcg Intramuscular Once Philip Aspen, CNM        No Known Allergies  Social History:  reports that she has never smoked. She has never used smokeless tobacco. She reports current alcohol use. She reports that she does not use drugs.  Family History  Problem Relation Age of Onset  . Diabetes Brother   . Breast cancer Neg Hx     The following portions of the patient's history were reviewed and updated as appropriate: allergies, current medications, past family history, past medical history, past social history, past surgical history and problem list.  Review of Systems Pertinent items noted in HPI and remainder of comprehensive ROS otherwise negative.  Physical Exam:  BP 122/83   Pulse 87   Ht 5\' 5"  (1.651 m)   Wt 163 lb 7 oz (74.1 kg)   BMI 27.20 kg/m  CONSTITUTIONAL: Well-developed, well-nourished female in no acute distress.  HENT:  Normocephalic, atraumatic, External right and left ear normal. Oropharynx is clear and moist EYES: Conjunctivae and EOM are normal. Pupils are equal, round, and reactive to light. No scleral icterus.  NECK: Normal range of motion, supple, no masses. enlarged thyroid.  SKIN: Skin is warm and dry. No rash noted. Not diaphoretic. No erythema. No pallor. MUSCULOSKELETAL: Normal range of motion. No tenderness.  No cyanosis, clubbing, or edema.  2+ distal pulses. NEUROLOGIC: Alert and oriented to person, place, and time. Normal reflexes, muscle tone coordination. No cranial nerve deficit noted. PSYCHIATRIC: Normal mood and affect.  Normal behavior. Normal judgment and thought content. CARDIOVASCULAR: Normal heart rate noted, regular rhythm RESPIRATORY: Clear to auscultation bilaterally. Effort and breath sounds normal, no problems with respiration noted. BREASTS: Symmetric in size. No masses, skin changes, nipple drainage, or lymphadenopathy. ABDOMEN: Soft, normal bowel sounds, no distention noted.  No tenderness, rebound or guarding.  PELVIC: Normal appearing external genitalia; normal appearing vaginal mucosa and cervix.  No abnormal discharge noted.  Pap smear not indicated. Strings present  Normal uterine size, no other palpable masses, no uterine or adnexal tenderness.   Assessment and Plan:   Women's annual routine GYN exam  Pap smear not indicated Mammogram scheduled Labs today TSH Medications ordered. B12, phentermine , Zoloft 50 mg Discussed weight loss diet and exercise plan. Reviewed red flag symptoms.  Routine preventative health maintenance measures emphasized. Please refer to After Visit Summary for other counseling recommendations.  Follow up 1 month for medication check and weight loss check.     Deneise Lever Amos Gaber,CNM

## 2018-12-11 LAB — THYROID PANEL WITH TSH
FREE THYROXINE INDEX: 1.9 (ref 1.2–4.9)
T3 Uptake Ratio: 28 % (ref 24–39)
T4, Total: 6.8 ug/dL (ref 4.5–12.0)
TSH: 1.46 u[IU]/mL (ref 0.450–4.500)

## 2018-12-18 ENCOUNTER — Other Ambulatory Visit: Payer: Self-pay

## 2018-12-19 ENCOUNTER — Other Ambulatory Visit: Payer: Self-pay

## 2018-12-19 MED ORDER — PHENTERMINE HCL 37.5 MG PO CAPS: 37.5000 mg | ORAL_CAPSULE | ORAL | 2 refills | Status: DC

## 2019-01-13 ENCOUNTER — Encounter: Payer: Managed Care, Other (non HMO) | Admitting: Obstetrics and Gynecology

## 2019-01-15 ENCOUNTER — Encounter: Payer: Self-pay | Admitting: Obstetrics and Gynecology

## 2019-01-15 ENCOUNTER — Other Ambulatory Visit: Payer: Self-pay

## 2019-01-15 ENCOUNTER — Ambulatory Visit (INDEPENDENT_AMBULATORY_CARE_PROVIDER_SITE_OTHER): Payer: Managed Care, Other (non HMO) | Admitting: Obstetrics and Gynecology

## 2019-01-15 VITALS — BP 122/83 | HR 87 | Ht 65.0 in | Wt 158.0 lb

## 2019-01-15 DIAGNOSIS — Z79899 Other long term (current) drug therapy: Secondary | ICD-10-CM | POA: Diagnosis not present

## 2019-01-15 DIAGNOSIS — F329 Major depressive disorder, single episode, unspecified: Secondary | ICD-10-CM

## 2019-01-15 DIAGNOSIS — F32A Depression, unspecified: Secondary | ICD-10-CM

## 2019-01-15 DIAGNOSIS — E663 Overweight: Secondary | ICD-10-CM

## 2019-01-15 NOTE — Progress Notes (Signed)
Virtual Visit via Telephone Note  I connected with Carollee Sires on 01/15/19 at  1:45 PM EDT by telephone and verified that I am speaking with the correct person using two identifiers.   I discussed the limitations, risks, security and privacy concerns of performing an evaluation and management service by telephone and the availability of in person appointments. I also discussed with the patient that there may be a patient responsible charge related to this service. The patient expressed understanding and agreed to proceed.   History of Present Illness: Was returning for weight check and to discuss how zoloft is working. See previous note from annual exam in February.   States she feels like adipex is working well. And desires continuing on it. States spouse can give her the B12 injection.    Depression screen [facility] 2/9 01/15/2019 12/10/2018  Decreased Interest 1 3  Down, Depressed, Hopeless 1 3  PHQ - 2 Score 2 6  Altered sleeping 3 3  Tired, decreased energy 1 3  Change in appetite 1 3  Feeling bad or failure about yourself  1 3  Trouble concentrating 1 3  Moving slowly or fidgety/restless 0 3  Suicidal thoughts 0 0  PHQ-9 Score 9 24  Difficult doing work/chores Somewhat difficult -   She states not having the extreme mood swings and getting as agitated. Concerned that she feels emotionally numb and blah though and doesn't like that.   Observations/Objective: Blood pressure 122/83, pulse 87, height 5\' 5"  (1.651 m), weight 158 lb (71.7 kg).  Assessment and Plan: Weight management depression  Follow Up Instructions:    I discussed the assessment and treatment plan with the patient. Will continue adipex and B12. Will 1/2 the zoloft dose for the next month and see if the numb feelings resolve, if not, then may try prozac or lexapro.The patient was provided an opportunity to ask questions and all were answered. The patient agreed with the plan and demonstrated an understanding of the  instructions. She will message me in one month with weight and BP and how she is feeling on the lower dose of zoloft.    The patient was advised to call back or seek an in-person evaluation if the symptoms worsen or if the condition fails to improve as anticipated.  I provided 15 minutes of non-face-to-face time during this encounter.   Johnthan Axtman Rockney Ghee, CNM

## 2019-01-18 ENCOUNTER — Other Ambulatory Visit: Payer: Self-pay | Admitting: Certified Nurse Midwife

## 2019-01-20 ENCOUNTER — Other Ambulatory Visit: Payer: Self-pay | Admitting: *Deleted

## 2019-01-20 MED ORDER — PHENTERMINE HCL 37.5 MG PO CAPS: 37.5000 mg | ORAL_CAPSULE | ORAL | 2 refills | Status: DC

## 2019-06-03 ENCOUNTER — Other Ambulatory Visit: Payer: Self-pay | Admitting: Certified Nurse Midwife

## 2019-06-03 DIAGNOSIS — Z1231 Encounter for screening mammogram for malignant neoplasm of breast: Secondary | ICD-10-CM

## 2019-07-07 ENCOUNTER — Ambulatory Visit
Admission: RE | Admit: 2019-07-07 | Discharge: 2019-07-07 | Disposition: A | Discharge status: Home / Self Care | Payer: Managed Care, Other (non HMO) | Source: Ambulatory Visit | Attending: Certified Nurse Midwife | Admitting: Certified Nurse Midwife

## 2019-07-07 DIAGNOSIS — Z1231 Encounter for screening mammogram for malignant neoplasm of breast: Secondary | ICD-10-CM | POA: Insufficient documentation

## 2019-12-23 ENCOUNTER — Telehealth: Payer: Self-pay | Admitting: Certified Nurse Midwife

## 2019-12-24 ENCOUNTER — Other Ambulatory Visit: Payer: Self-pay

## 2019-12-24 ENCOUNTER — Ambulatory Visit: Payer: Managed Care, Other (non HMO) | Admitting: Certified Nurse Midwife

## 2019-12-24 ENCOUNTER — Encounter: Payer: Self-pay | Admitting: Certified Nurse Midwife

## 2019-12-24 VITALS — BP 118/79 | HR 75 | Ht 65.0 in | Wt 160.1 lb

## 2019-12-24 DIAGNOSIS — K625 Hemorrhage of anus and rectum: Secondary | ICD-10-CM | POA: Diagnosis not present

## 2019-12-24 DIAGNOSIS — K649 Unspecified hemorrhoids: Secondary | ICD-10-CM

## 2019-12-24 MED ORDER — HYDROCORTISONE ACETATE 25 MG RE SUPP: 25.0000 mg | Freq: Two times a day (BID) | RECTAL | 0 refills | Status: DC

## 2019-12-24 NOTE — Patient Instructions (Signed)
Hydrocortisone suppositories What is this medicine? HYDROCORTISONE (hye droe KOR ti sone) is a corticosteroid. It is used to decrease swelling, itching, and pain that is caused by minor skin irritations or by hemorrhoids. This medicine may be used for other purposes; ask your health care provider or pharmacist if you have questions. COMMON BRAND NAME(S): Anucort-HC, Anumed-HC, Anusol HC, Encort, GRx HiCort, Hemmorex-HC, Hemorrhoidal-HC, Hemril, Proctocort, Proctosert HC, Proctosol-HC, Rectacort HC, Rectasol-HC What should I tell my health care provider before I take this medicine? They need to know if you have any of these conditions:  an unusual or allergic reaction to hydrocortisone, corticosteroids, other medicines, foods, dyes, or preservatives  pregnant or trying to get pregnant  breast-feeding How should I use this medicine? This medicine is for rectal use only. Do not take by mouth. Wash your hands before and after use. Take off the foil wrapping. Wet the tip of the suppository with cold tap water to make it easier to use. Lie on your side with your lower leg straightened out and your upper leg bent forward toward your stomach. Lift upper buttock to expose the rectal area. Apply gentle pressure to insert the suppository completely into the rectum, pointed end first. Hold buttocks together for a few seconds. Remain lying down for about 15 minutes to avoid having the suppository come out. Do not use more often than directed. Talk to your pediatrician regarding the use of this medicine in children. Special care may be needed. Overdosage: If you think you have taken too much of this medicine contact a poison control center or emergency room at once. NOTE: This medicine is only for you. Do not share this medicine with others. What if I miss a dose? If you miss a dose, use it as soon as you can. If it is almost time for your next dose, use only that dose. Do not use double or extra doses. What  may interact with this medicine? Interactions are not expected. Do not use any other rectal products on the affected area without telling your doctor or health care professional. This list may not describe all possible interactions. Give your health care provider a list of all the medicines, herbs, non-prescription drugs, or dietary supplements you use. Also tell them if you smoke, drink alcohol, or use illegal drugs. Some items may interact with your medicine. What should I watch for while using this medicine? Visit your doctor or health care professional for regular checks on your progress. Tell your doctor or health care professional if your symptoms do not improve after a few days of use. Do not use if there is blood in your stools. If you get any type of infection while using this medicine, you may need to stop using this medicine until our infections clears up. Ask your doctor or health care professional for advice. What side effects may I notice from receiving this medicine? Side effects that you should report to your doctor or health care professional as soon as possible:  bloody or black, tarry stools  painful, red, pus filled blisters in hair follicles  rectal pain, burning or bleeding after use of medicine Side effects that usually do not require medical attention (report to your doctor or health care professional if they continue or are bothersome):  changes in skin color  dry skin  itching or irritation This list may not describe all possible side effects. Call your doctor for medical advice about side effects. You may report side effects to FDA at  1-800-FDA-1088. Where should I keep my medicine? Keep out of the reach of children. Store at room temperature between 20 and 25 degrees C (68 and 77 degrees F). Protect from heat and freezing. Throw away any unused medicine after the expiration date. NOTE: This sheet is a summary. It may not cover all possible information. If you have  questions about this medicine, talk to your doctor, pharmacist, or health care provider.  2020 Elsevier/Gold Standard (2008-02-13 16:07:24)   Hemorrhoids Hemorrhoids are swollen veins in and around the rectum or anus. There are two types of hemorrhoids:  Internal hemorrhoids. These occur in the veins that are just inside the rectum. They may poke through to the outside and become irritated and painful.  External hemorrhoids. These occur in the veins that are outside the anus and can be felt as a painful swelling or hard lump near the anus. Most hemorrhoids do not cause serious problems, and they can be managed with home treatments such as diet and lifestyle changes. If home treatments do not help the symptoms, procedures can be done to shrink or remove the hemorrhoids. What are the causes? This condition is caused by increased pressure in the anal area. This pressure may result from various things, including:  Constipation.  Straining to have a bowel movement.  Diarrhea.  Pregnancy.  Obesity.  Sitting for long periods of time.  Heavy lifting or other activity that causes you to strain.  Anal sex.  Riding a bike for a long period of time. What are the signs or symptoms? Symptoms of this condition include:  Pain.  Anal itching or irritation.  Rectal bleeding.  Leakage of stool (feces).  Anal swelling.  One or more lumps around the anus. How is this diagnosed? This condition can often be diagnosed through a visual exam. Other exams or tests may also be done, such as:  An exam that involves feeling the rectal area with a gloved hand (digital rectal exam).  An exam of the anal canal that is done using a small tube (anoscope).  A blood test, if you have lost a significant amount of blood.  A test to look inside the colon using a flexible tube with a camera on the end (sigmoidoscopy or colonoscopy). How is this treated? This condition can usually be treated at home.  However, various procedures may be done if dietary changes, lifestyle changes, and other home treatments do not help your symptoms. These procedures can help make the hemorrhoids smaller or remove them completely. Some of these procedures involve surgery, and others do not. Common procedures include:  Rubber band ligation. Rubber bands are placed at the base of the hemorrhoids to cut off their blood supply.  Sclerotherapy. Medicine is injected into the hemorrhoids to shrink them.  Infrared coagulation. A type of light energy is used to get rid of the hemorrhoids.  Hemorrhoidectomy surgery. The hemorrhoids are surgically removed, and the veins that supply them are tied off.  Stapled hemorrhoidopexy surgery. The surgeon staples the base of the hemorrhoid to the rectal wall. Follow these instructions at home: Eating and drinking   Eat foods that have a lot of fiber in them, such as whole grains, beans, nuts, fruits, and vegetables.  Ask your health care provider about taking products that have added fiber (fiber supplements).  Reduce the amount of fat in your diet. You can do this by eating low-fat dairy products, eating less red meat, and avoiding processed foods.  Drink enough fluid to keep  your urine pale yellow. Managing pain and swelling   Take warm sitz baths for 20 minutes, 3-4 times a day to ease pain and discomfort. You may do this in a bathtub or using a portable sitz bath that fits over the toilet.  If directed, apply ice to the affected area. Using ice packs between sitz baths may be helpful. ? Put ice in a plastic bag. ? Place a towel between your skin and the bag. ? Leave the ice on for 20 minutes, 2-3 times a day. General instructions  Take over-the-counter and prescription medicines only as told by your health care provider.  Use medicated creams or suppositories as told.  Get regular exercise. Ask your health care provider how much and what kind of exercise is best  for you. In general, you should do moderate exercise for at least 30 minutes on most days of the week (150 minutes each week). This can include activities such as walking, biking, or yoga.  Go to the bathroom when you have the urge to have a bowel movement. Do not wait.  Avoid straining to have bowel movements.  Keep the anal area dry and clean. Use wet toilet paper or moist towelettes after a bowel movement.  Do not sit on the toilet for long periods of time. This increases blood pooling and pain.  Keep all follow-up visits as told by your health care provider. This is important. Contact a health care provider if you have:  Increasing pain and swelling that are not controlled by treatment or medicine.  Difficulty having a bowel movement, or you are unable to have a bowel movement.  Pain or inflammation outside the area of the hemorrhoids. Get help right away if you have:  Uncontrolled bleeding from your rectum. Summary  Hemorrhoids are swollen veins in and around the rectum or anus.  Most hemorrhoids can be managed with home treatments such as diet and lifestyle changes.  Taking warm sitz baths can help ease pain and discomfort.  In severe cases, procedures or surgery can be done to shrink or remove the hemorrhoids. This information is not intended to replace advice given to you by your health care provider. Make sure you discuss any questions you have with your health care provider. Document Revised: 02/27/2019 Document Reviewed: 02/20/2018 Elsevier Patient Education  [place].

## 2019-12-24 NOTE — Progress Notes (Signed)
Patient c/o "very painful" hemorrhoids x4 days, rectal bleeding earlier on in the week.  Using preparation H cream and wipes with no relief.

## 2019-12-25 ENCOUNTER — Encounter: Payer: Managed Care, Other (non HMO) | Admitting: Certified Nurse Midwife

## 2019-12-26 NOTE — Progress Notes (Signed)
Marcia Green is a 45 y.o. year old Caucasian female here incision and drainage of inflamed hemorrhoid.  Patient given informed consent.   BP 118/79   Pulse 75   Ht 5\' 5"  (1.651 m)   Wt 160 lb 1.6 oz (72.6 kg)   LMP  (LMP Unknown)   BMI 26.64 kg/m   Appropriate time out taken. Single grape sized red hemorrhoid identified.  Area prepped in usual sterile fashon. One cc of 2% lidocaine was used to anesthetize the area. A small stab incision was made and blood was drained. Hemorrhoid restored to rectum upon digitally exam.    There was less than 3 cc blood loss. There were no complications.   The patient tolerated the procedure well.  She was instructed to keep the area clean and dry. An ice pack pad was provided.   Rx Anusol, see orders.   Reviewed red flag symptoms and when to call.   RTC if symptoms worsen or fail to improve.    Diona Fanti, CNM [facility], [facility]

## 2020-01-02 ENCOUNTER — Ambulatory Visit: Payer: Managed Care, Other (non HMO) | Attending: Internal Medicine

## 2020-01-02 DIAGNOSIS — Z23 Encounter for immunization: Secondary | ICD-10-CM

## 2020-01-02 NOTE — Progress Notes (Signed)
   Covid-19 Vaccination Clinic  Name:  FRIEDA FRICANO    MRN: [id number] DOB: [birth year]  01/02/2020  Ms. Gloss was observed post Covid-19 immunization for 15 minutes without incident. She was provided with Vaccine Information Sheet and instruction to access the V-Safe system.   Ms. Hyde was instructed to call 911 with any severe reactions post vaccine: Marland Kitchen Difficulty breathing  . Swelling of face and throat  . A fast heartbeat  . A bad rash all over body  . Dizziness and weakness   Immunizations Administered    Name Date Dose VIS Date Route   Pfizer COVID-19 Vaccine 01/02/2020  1:39 PM 0.3 mL 09/25/2019 Intramuscular   Manufacturer: [employer], [employer]   Lot: [id number]   [place]: [id number]

## 2020-01-14 NOTE — Telephone Encounter (Signed)
error 

## 2020-01-27 ENCOUNTER — Ambulatory Visit: Payer: Managed Care, Other (non HMO) | Attending: Internal Medicine

## 2020-01-27 DIAGNOSIS — Z23 Encounter for immunization: Secondary | ICD-10-CM

## 2020-01-27 NOTE — Progress Notes (Signed)
   Covid-19 Vaccination Clinic  Name:  ZARETH REZNICEK    MRN: [id number] DOB: Nov 12, 1975  01/27/2020  Ms. Gravatt was observed post Covid-19 immunization for 15 minutes without incident. She was provided with Vaccine Information Sheet and instruction to access the V-Safe system.   Ms. Shu was instructed to call 911 with any severe reactions post vaccine: Marland Kitchen Difficulty breathing  . Swelling of face and throat  . A fast heartbeat  . A bad rash all over body  . Dizziness and weakness   Immunizations Administered    Name Date Dose VIS Date Route   Pfizer COVID-19 Vaccine 01/27/2020 10:42 AM 0.3 mL 09/25/2019 Intramuscular   Manufacturer: [place]   Lot: [id number]   [place]: [id number]

## 2020-01-28 ENCOUNTER — Other Ambulatory Visit: Payer: Self-pay | Admitting: Otolaryngology

## 2020-01-28 DIAGNOSIS — J320 Chronic maxillary sinusitis: Secondary | ICD-10-CM

## 2020-02-10 ENCOUNTER — Ambulatory Visit
Admission: RE | Admit: 2020-02-10 | Discharge: 2020-02-10 | Disposition: A | Discharge status: Home / Self Care | Payer: Managed Care, Other (non HMO) | Source: Ambulatory Visit | Attending: Otolaryngology | Admitting: Otolaryngology

## 2020-02-10 ENCOUNTER — Other Ambulatory Visit: Payer: Self-pay

## 2020-02-10 DIAGNOSIS — J320 Chronic maxillary sinusitis: Secondary | ICD-10-CM | POA: Diagnosis not present

## 2020-04-05 ENCOUNTER — Encounter: Payer: Managed Care, Other (non HMO) | Admitting: Certified Nurse Midwife

## 2020-04-27 ENCOUNTER — Other Ambulatory Visit: Payer: Self-pay

## 2020-04-27 ENCOUNTER — Ambulatory Visit (INDEPENDENT_AMBULATORY_CARE_PROVIDER_SITE_OTHER): Payer: Managed Care, Other (non HMO) | Admitting: Certified Nurse Midwife

## 2020-04-27 ENCOUNTER — Encounter: Payer: Self-pay | Admitting: Certified Nurse Midwife

## 2020-04-27 VITALS — BP 115/73 | HR 73 | Ht 65.0 in | Wt 159.4 lb

## 2020-04-27 DIAGNOSIS — K649 Unspecified hemorrhoids: Secondary | ICD-10-CM

## 2020-04-27 DIAGNOSIS — Z1231 Encounter for screening mammogram for malignant neoplasm of breast: Secondary | ICD-10-CM

## 2020-04-27 DIAGNOSIS — F419 Anxiety disorder, unspecified: Secondary | ICD-10-CM | POA: Diagnosis not present

## 2020-04-27 DIAGNOSIS — Z01419 Encounter for gynecological examination (general) (routine) without abnormal findings: Secondary | ICD-10-CM | POA: Diagnosis not present

## 2020-04-27 DIAGNOSIS — Z23 Encounter for immunization: Secondary | ICD-10-CM

## 2020-04-27 DIAGNOSIS — Z113 Encounter for screening for infections with a predominantly sexual mode of transmission: Secondary | ICD-10-CM

## 2020-04-27 MED ORDER — TETANUS-DIPHTH-ACELL PERTUSSIS 5-2.5-18.5 LF-MCG/0.5 IM SUSP
0.5000 mL | Freq: Once | INTRAMUSCULAR | Status: AC
  Administered 2020-04-27: 0.5 mL via INTRAMUSCULAR

## 2020-04-27 NOTE — Patient Instructions (Signed)

## 2020-04-27 NOTE — Progress Notes (Signed)
GYNECOLOGY ANNUAL PREVENTATIVE CARE ENCOUNTER NOTE  History:     Marcia Green is a 44 y.o. [id number] female here for a routine annual gynecologic exam.  Current complaints: none.   Denies abnormal vaginal bleeding, discharge, pelvic pain, problems with intercourse or other gynecologic concerns.     Social Relationship married  Living: husband and step daughter Works FT for [employer] at home Exercise: 3x wk for 30 min Smoke/alcohol/drugs: occasional alcohol   Gynecologic History No LMP recorded. (Menstrual status: IUD). Contraception: IUD Last Pap: 5/21/2019Results were: normal with negative HPV Last mammogram: 07/07/19. Results were: normal  Obstetric History OB History  Gravida Para Term Preterm AB Living  2 2 2     2   SAB TAB Ectopic Multiple Live Births          2    # Outcome Date GA Lbr Len/2nd Weight Sex Delivery Anes PTL Lv  2 Term 07/22/00   8 lb 1 oz (3.657 kg) M Vag-Spont None N LIV  1 Term 05/03/95   7 lb 1 oz (3.204 kg) M Vag-Spont None N LIV    Past Medical History:  Diagnosis Date   Anxiety    Depression    Fatigue    Heavy periods    Migraines    UTI (lower urinary tract infection)    Vitamin D deficiency     No past surgical history on file.  Current Outpatient Medications on File Prior to Visit  Medication Sig Dispense Refill   cetirizine (ZYRTEC) 10 MG tablet Take by mouth.     fluticasone (FLONASE) 50 MCG/ACT nasal spray Place into both nostrils daily.     hydrocortisone (ANUSOL-HC) 25 MG suppository Place 1 suppository (25 mg total) rectally 2 (two) times daily. 12 suppository 0   levonorgestrel (MIRENA) 20 MCG/24HR IUD 1 each by Intrauterine route once.     olopatadine (PATANOL) 0.1 % ophthalmic solution INSTILL 1 DROP INTO BOTH EYES TWICE A DAY     Current Facility-Administered Medications on File Prior to Visit  Medication Dose Route Frequency Provider Last Rate Last Admin   cyanocobalamin ((VITAMIN B-12)) injection 1,000  mcg  1,000 mcg Intramuscular Once Philip Aspen, CNM        No Known Allergies  Social History:  reports that she has never smoked. She has never used smokeless tobacco. She reports current alcohol use. She reports that she does not use drugs.  Family History  Problem Relation Age of Onset   Diabetes Brother    Breast cancer Neg Hx    Ovarian cancer Neg Hx    Colon cancer Neg Hx     The following portions of the patient's history were reviewed and updated as appropriate: allergies, current medications, past family history, past medical history, past social history, past surgical history and problem list.  Review of Systems Pertinent items noted in HPI and remainder of comprehensive ROS otherwise negative.  Physical Exam:  BP 115/73    Pulse 73    Ht 5\' 5"  (1.651 m)    Wt 159 lb 7 oz (72.3 kg)    BMI 26.53 kg/m  CONSTITUTIONAL: Well-developed, well-nourished female in no acute distress.  HENT:  Normocephalic, atraumatic, External right and left ear normal. Oropharynx is clear and moist EYES: Conjunctivae and EOM are normal. Pupils are equal, round, and reactive to light. No scleral icterus.  NECK: Normal range of motion, supple, no masses.  Normal thyroid.  SKIN: Skin is warm and dry. No  rash noted. Not diaphoretic. No erythema. No pallor. MUSCULOSKELETAL: Normal range of motion. No tenderness.  No cyanosis, clubbing, or edema.  2+ distal pulses. NEUROLOGIC: Alert and oriented to person, place, and time. Normal reflexes, muscle tone coordination.  PSYCHIATRIC: Normal mood and affect. Normal behavior. Normal judgment and thought content. CARDIOVASCULAR: Normal heart rate noted, regular rhythm RESPIRATORY: Clear to auscultation bilaterally. Effort and breath sounds normal, no problems with respiration noted. BREASTS: Symmetric in size. No masses, tenderness, skin changes, nipple drainage, or lymphadenopathy bilaterally.  ABDOMEN: Soft, no distention noted.  No tenderness, rebound  or guarding.  PELVIC: Normal appearing external genitalia and urethral meatus; normal appearing vaginal mucosa and cervix.  No abnormal discharge noted.  Pap smear not collected.  Normal uterine size, no other palpable masses, no uterine or adnexal tenderness.   Assessment and Plan:    1. Women's annual routine gynecological examination Pap smear not indicated Mammogram ordered Labs: Hep C/HIV-health maintance Will come back for lab visit Refills: none Referrals:psychiatry for anxiety, referral general surgery for hemorrhoid removal Routine preventative health maintenance measures emphasized. Please refer to After Visit Summary for other counseling recommendations.      Philip Aspen, CNM  [facility],  [employer]

## 2020-05-03 ENCOUNTER — Ambulatory Visit (INDEPENDENT_AMBULATORY_CARE_PROVIDER_SITE_OTHER): Payer: Managed Care, Other (non HMO) | Admitting: Surgery

## 2020-05-03 ENCOUNTER — Encounter: Payer: Self-pay | Admitting: Surgery

## 2020-05-03 ENCOUNTER — Other Ambulatory Visit: Payer: Self-pay

## 2020-05-03 VITALS — BP 125/89 | HR 76 | Temp 97.9°F | Ht 65.0 in | Wt 158.0 lb

## 2020-05-03 DIAGNOSIS — K640 First degree hemorrhoids: Secondary | ICD-10-CM | POA: Insufficient documentation

## 2020-05-03 NOTE — Progress Notes (Signed)
Patient ID: Marcia Green, female   DOB: 07/13/1976, 44 y.o.   MRN: [id number]  Chief Complaint: Hemorrhoid  History of Present Illness Marcia Green is a 44 y.o. female with hemorrhoids which developed after pregnancy.  Robotically recurring in their presence off and on.  In March of this year she underwent a lancing for what is presumed to be a thrombosed hemorrhoid.  She denies any history of constipation, reporting that currently she moves 1-2 times daily.  She denies blood in her stools she reports some discomfort, knowing it is there and also sensing its presence with certain positions and sitting.  She is utilize sitz bath's in the past and other some other medications.  She has no family history of colon cancer and has had no prior colonoscopy.  She reports the hemorrhoid of concern shrink in size since March.  Past Medical History Past Medical History:  Diagnosis Date  . Anxiety   . Depression   . Fatigue   . Heavy periods   . Migraines   . UTI (lower urinary tract infection)   . Vitamin D deficiency       History reviewed. No pertinent surgical history.  No Known Allergies  Current Outpatient Medications  Medication Sig Dispense Refill  . cetirizine (ZYRTEC) 10 MG tablet Take by mouth.    . fluticasone (FLONASE) 50 MCG/ACT nasal spray Place into both nostrils daily.    . hydrocortisone (ANUSOL-HC) 25 MG suppository Place 1 suppository (25 mg total) rectally 2 (two) times daily. 12 suppository 0  . levonorgestrel (MIRENA) 20 MCG/24HR IUD 1 each by Intrauterine route once.    Marland Kitchen olopatadine (PATANOL) 0.1 % ophthalmic solution INSTILL 1 DROP INTO BOTH EYES TWICE A DAY     Current Facility-Administered Medications  Medication Dose Route Frequency Provider Last Rate Last Admin  . cyanocobalamin ((VITAMIN B-12)) injection 1,000 mcg  1,000 mcg Intramuscular Once Philip Aspen, CNM        Family History Family History  Problem Relation Age of Onset  . Diabetes Brother   .  Congestive Heart Failure Father   . Breast cancer Neg Hx   . Ovarian cancer Neg Hx   . Colon cancer Neg Hx       Social History Social History   Tobacco Use  . Smoking status: Never Smoker  . Smokeless tobacco: Never Used  Vaping Use  . Vaping Use: Never used  Substance Use Topics  . Alcohol use: Yes    Comment: occas  . Drug use: No        Review of Systems  Constitutional: Negative.   HENT: Negative.   Eyes: Negative.   Respiratory: Negative.   Cardiovascular: Negative.   Gastrointestinal: Negative.   Genitourinary: Negative.   Skin: Negative.   Neurological: Negative.   Psychiatric/Behavioral: Negative.       Physical Exam Blood pressure 125/89, pulse 76, temperature 97.9 F (36.6 C), temperature source Oral, height 5\' 5"  (1.651 m), weight 158 lb (71.7 kg), SpO2 98 %. Last Weight  Most recent update: 05/03/2020 11:05 AM   Weight  71.7 kg (158 lb)            CONSTITUTIONAL: Well developed, and nourished, appropriately responsive and aware without distress.   EYES: Sclera non-icteric.   EARS, NOSE, MOUTH AND THROAT: Mask worn.   Hearing is intact to voice.  NECK: Trachea is midline, and there is no jugular venous distension.  LYMPH NODES:  Lymph nodes in the neck are not  enlarged. RESPIRATORY:  Lungs are clear, and breath sounds are equal bilaterally. Normal respiratory effort without pathologic use of accessory muscles. CARDIOVASCULAR: Heart is regular in rate and rhythm. GI: The abdomen is soft, nontender, and nondistended. There were no palpable masses. I did not appreciate hepatosplenomegaly. There were normal bowel sounds. GU: Digital rectal exam shows a small mixed internal and external hemorrhoid at 6:00 which is anterior, there is a small blue foci consistent with a tiny area of thrombosis which is nontender.  There is no other area of redundant hemorrhoidal tissue circumferentially.  No other piles no other external skin tags.  Distal rectum is without  masses, nodularity.  There may be a mild rectocele. MUSCULOSKELETAL:  Symmetrical muscle tone appreciated in all four extremities.    SKIN: Skin turgor is normal. No pathologic skin lesions appreciated.  NEUROLOGIC:  Motor and sensation appear grossly normal.  Cranial nerves are grossly without defect. PSYCH:  Alert and oriented to person, place and time. Affect is appropriate for situation.  Data Reviewed I have personally reviewed what is currently available of the patient's imaging, recent labs and medical records.   Labs:  No flowsheet data found.    Imaging: Within last 24 hrs: No results found.  Assessment    Minimal grade 1 hemorrhoidal disease, with history of possible lancing of external thrombotic hemorrhoid. No history of constipation.  Quite asymptomatic actually. Patient Active Problem List   Diagnosis Date Noted  . Vitamin D deficiency 10/05/2015    Plan    I did not recommend pursuing surgery for this degree of hemorrhoidal issues.  However I made myself available should she desire to pursue further excision of this limited hemorrhoidal disease in the future. I emphasized that the addition of fiber to the diet and ensuring adequate fluid intake would help maintain regularity and hopefully prevent developing worse disease, and also be somewhat proactive against any diverticulosis as well. We will be glad to follow her up as needed.  Face-to-face time spent with the patient and accompanying care providers(if present) was 30 minutes, with more than 50% of the time spent counseling, educating, and coordinating care of the patient.      Ronny Bacon M.D., FACS 05/03/2020, 11:55 AM

## 2020-05-03 NOTE — Patient Instructions (Addendum)
Dr.Rodenberg recommends patient to increase Fiber intake or Fiber supplements to help with bowel regularity.  Hemorrhoids Hemorrhoids are swollen veins in and around the rectum or anus. There are two types of hemorrhoids:  Internal hemorrhoids. These occur in the veins that are just inside the rectum. They may poke through to the outside and become irritated and painful.  External hemorrhoids. These occur in the veins that are outside the anus and can be felt as a painful swelling or hard lump near the anus. Most hemorrhoids do not cause serious problems, and they can be managed with home treatments such as diet and lifestyle changes. If home treatments do not help the symptoms, procedures can be done to shrink or remove the hemorrhoids. What are the causes? This condition is caused by increased pressure in the anal area. This pressure may result from various things, including:  Constipation.  Straining to have a bowel movement.  Diarrhea.  Pregnancy.  Obesity.  Sitting for long periods of time.  Heavy lifting or other activity that causes you to strain.  Anal sex.  Riding a bike for a long period of time. What are the signs or symptoms? Symptoms of this condition include:  Pain.  Anal itching or irritation.  Rectal bleeding.  Leakage of stool (feces).  Anal swelling.  One or more lumps around the anus. How is this diagnosed? This condition can often be diagnosed through a visual exam. Other exams or tests may also be done, such as:  An exam that involves feeling the rectal area with a gloved hand (digital rectal exam).  An exam of the anal canal that is done using a small tube (anoscope).  A blood test, if you have lost a significant amount of blood.  A test to look inside the colon using a flexible tube with a camera on the end (sigmoidoscopy or colonoscopy). How is this treated? This condition can usually be treated at home. However, various procedures may be  done if dietary changes, lifestyle changes, and other home treatments do not help your symptoms. These procedures can help make the hemorrhoids smaller or remove them completely. Some of these procedures involve surgery, and others do not. Common procedures include:  Rubber band ligation. Rubber bands are placed at the base of the hemorrhoids to cut off their blood supply.  Sclerotherapy. Medicine is injected into the hemorrhoids to shrink them.  Infrared coagulation. A type of light energy is used to get rid of the hemorrhoids.  Hemorrhoidectomy surgery. The hemorrhoids are surgically removed, and the veins that supply them are tied off.  Stapled hemorrhoidopexy surgery. The surgeon staples the base of the hemorrhoid to the rectal wall. Follow these instructions at home: Eating and drinking   Eat foods that have a lot of fiber in them, such as whole grains, beans, nuts, fruits, and vegetables.  Ask your health care provider about taking products that have added fiber (fiber supplements).  Reduce the amount of fat in your diet. You can do this by eating low-fat dairy products, eating less red meat, and avoiding processed foods.  Drink enough fluid to keep your urine pale yellow. Managing pain and swelling   Take warm sitz baths for 20 minutes, 3-4 times a day to ease pain and discomfort. You may do this in a bathtub or using a portable sitz bath that fits over the toilet.  If directed, apply ice to the affected area. Using ice packs between sitz baths may be helpful. ? Put ice  in a plastic bag. ? Place a towel between your skin and the bag. ? Leave the ice on for 20 minutes, 2-3 times a day. General instructions  Take over-the-counter and prescription medicines only as told by your health care provider.  Use medicated creams or suppositories as told.  Get regular exercise. Ask your health care provider how much and what kind of exercise is best for you. In general, you should do  moderate exercise for at least 30 minutes on most days of the week (150 minutes each week). This can include activities such as walking, biking, or yoga.  Go to the bathroom when you have the urge to have a bowel movement. Do not wait.  Avoid straining to have bowel movements.  Keep the anal area dry and clean. Use wet toilet paper or moist towelettes after a bowel movement.  Do not sit on the toilet for long periods of time. This increases blood pooling and pain.  Keep all follow-up visits as told by your health care provider. This is important. Contact a health care provider if you have:  Increasing pain and swelling that are not controlled by treatment or medicine.  Difficulty having a bowel movement, or you are unable to have a bowel movement.  Pain or inflammation outside the area of the hemorrhoids. Get help right away if you have:  Uncontrolled bleeding from your rectum. Summary  Hemorrhoids are swollen veins in and around the rectum or anus.  Most hemorrhoids can be managed with home treatments such as diet and lifestyle changes.  Taking warm sitz baths can help ease pain and discomfort.  In severe cases, procedures or surgery can be done to shrink or remove the hemorrhoids. This information is not intended to replace advice given to you by your health care provider. Make sure you discuss any questions you have with your health care provider. Document Revised: 02/27/2019 Document Reviewed: 02/20/2018 Elsevier Patient Education  2020 [place], Adult  A skin tag (acrochordon) is a soft, extra growth of skin. Most skin tags are flesh-colored and rarely bigger than a pencil eraser. They commonly form near areas where there are folds in the skin, such as the armpit or groin. Skin tags are not dangerous, and they do not spread from person to person (are not contagious). You may have one skin tag or several. Skin tags do not require treatment. However, your  health care provider may recommend removal of a skin tag if it:  Gets irritated from clothing.  Bleeds.  Is visible and unsightly. Your health care provider can remove skin tags with a simple surgical procedure or a procedure that involves freezing the skin tag. Follow these instructions at home:  Watch for any changes in your skin tag. A normal skin tag does not require any other special care at home.  Take over-the-counter and prescription medicines only as told by your health care provider.  Keep all follow-up visits as told by your health care provider. This is important. Contact a health care provider if:  You have a skin tag that: ? Becomes painful. ? Changes color. ? Bleeds. ? Swells.  You develop more skin tags. This information is not intended to replace advice given to you by your health care provider. Make sure you discuss any questions you have with your health care provider. Document Revised: 09/13/2017 Document Reviewed: 10/16/2015 Elsevier Patient Education  2020 [employer].

## 2020-05-04 ENCOUNTER — Other Ambulatory Visit: Payer: Managed Care, Other (non HMO)

## 2020-05-04 ENCOUNTER — Other Ambulatory Visit: Payer: Self-pay

## 2020-05-04 DIAGNOSIS — Z113 Encounter for screening for infections with a predominantly sexual mode of transmission: Secondary | ICD-10-CM

## 2020-05-04 DIAGNOSIS — Z01419 Encounter for gynecological examination (general) (routine) without abnormal findings: Secondary | ICD-10-CM

## 2020-05-05 LAB — HIV ANTIBODY (ROUTINE TESTING W REFLEX): HIV Screen 4th Generation wRfx: NONREACTIVE

## 2020-05-05 LAB — HCV AB W/RFLX TO VERIFICATION: HCV Ab: <0.1 s/co ratio (ref 0.0–0.9)

## 2020-05-05 LAB — HCV INTERPRETATION

## 2020-05-16 ENCOUNTER — Other Ambulatory Visit: Payer: Self-pay | Admitting: Certified Nurse Midwife

## 2020-05-16 MED ORDER — PRAMOXINE-HC 1-2.5 % EX CREA: TOPICAL_CREAM | Freq: Three times a day (TID) | CUTANEOUS | 0 refills | Status: DC

## 2020-05-17 ENCOUNTER — Other Ambulatory Visit: Payer: Self-pay | Admitting: Certified Nurse Midwife

## 2020-05-17 DIAGNOSIS — K649 Unspecified hemorrhoids: Secondary | ICD-10-CM

## 2020-05-17 MED ORDER — HYDROCORTISONE (PERIANAL) 2.5 % EX CREA: 1.0000 "application " | TOPICAL_CREAM | Freq: Two times a day (BID) | CUTANEOUS | 0 refills | Status: DC

## 2020-05-17 MED ORDER — HYDROCORTISONE ACETATE 25 MG RE SUPP: 25.0000 mg | Freq: Two times a day (BID) | RECTAL | 0 refills | Status: DC

## 2020-05-18 ENCOUNTER — Ambulatory Visit: Payer: Self-pay | Admitting: Licensed Clinical Social Worker

## 2020-06-01 ENCOUNTER — Ambulatory Visit
Admission: RE | Admit: 2020-06-01 | Discharge: 2020-06-01 | Disposition: A | Discharge status: Home / Self Care | Payer: Managed Care, Other (non HMO) | Attending: Physician Assistant | Admitting: Physician Assistant

## 2020-06-01 ENCOUNTER — Other Ambulatory Visit: Payer: Self-pay

## 2020-06-01 ENCOUNTER — Other Ambulatory Visit: Payer: Self-pay | Admitting: Physician Assistant

## 2020-06-01 ENCOUNTER — Ambulatory Visit
Admission: RE | Admit: 2020-06-01 | Discharge: 2020-06-01 | Disposition: A | Discharge status: Home / Self Care | Payer: Managed Care, Other (non HMO) | Source: Ambulatory Visit | Attending: Physician Assistant | Admitting: Physician Assistant

## 2020-06-01 DIAGNOSIS — R05 Cough: Secondary | ICD-10-CM | POA: Diagnosis present

## 2020-06-01 DIAGNOSIS — R059 Cough, unspecified: Secondary | ICD-10-CM

## 2020-06-02 ENCOUNTER — Encounter: Payer: Self-pay | Admitting: Gastroenterology

## 2020-08-03 ENCOUNTER — Ambulatory Visit: Payer: Managed Care, Other (non HMO) | Admitting: Gastroenterology

## 2020-08-03 ENCOUNTER — Encounter: Payer: Self-pay | Admitting: Gastroenterology

## 2020-08-03 VITALS — BP 104/74 | HR 107 | Ht 65.0 in | Wt 155.6 lb

## 2020-08-03 DIAGNOSIS — K6289 Other specified diseases of anus and rectum: Secondary | ICD-10-CM | POA: Diagnosis not present

## 2020-08-03 MED ORDER — PLENVU 140 G PO SOLR
140.0000 g | ORAL | 0 refills | Status: DC
Start: 2020-08-03 — End: 2020-08-10

## 2020-08-03 NOTE — Progress Notes (Signed)
Anal photograph

## 2020-08-03 NOTE — Progress Notes (Signed)
Referring Provider: Philip Aspen, CNM Primary Care Physician:  Philip Aspen, CNM  Reason for Consultation:  Hemorrhoids    IMPRESSION:  Rectal discomfort External hemorrhoids on rectal exam  Symptomatic external hemorrhoids that are refractory to conservative management are generally treated surgically. Colonoscopy recommended to evaluate and exclude other causes.  PLAN: Add psyllium or methylcellulose daily Continue local supportive therapy of external hemorrhoids Colonoscopy  Please see the "Patient Instructions" section for addition details about the plan.  HPI: Marcia Green is a 44 y.o. female referred by Philip Aspen for further evaluation of hemorrhoids. She has anxiety, chronic headaches, and occasional UTIs.   Has had hemorrhoids since childbirth.  March 2021 she underwent a lancing for what is presumed to be a thrombosed hemorrhoid that developed after heavy lifting.  She denies any history of constipation except for a transient episode in early 2021. Baseline bowel habits are 1-2 formed bowel movements daily.  She denies blood in her stools and primarily has discomfort and irritation from hemorrhoids. Aware of the hemorrhoid with certain positions and sitting. Although it's smaller since March, it's still present. Hygiene concerns after defecation.   Surgical exam 05/03/20 notes: Minimal grade 1 hemorrhoidal disease, with history of possible lancing of external thrombotic hemorrhoid  She has used sitz baths, Anusol, and steroids suppositories.  No known family history of colon cancer or polyps. No family history of uterine/endometrial cancer, pancreatic cancer or gastric/stomach cancer.   Past Medical History:  Diagnosis Date   Anxiety    Depression    Fatigue    Heavy periods    Migraines    UTI (lower urinary tract infection)    Vitamin D deficiency     Past Surgical History:  Procedure Laterality Date   TONSILLECTOMY     WISDOM TOOTH  EXTRACTION      Current Outpatient Medications  Medication Sig Dispense Refill   cetirizine (ZYRTEC) 10 MG tablet Take 10 mg by mouth as needed.      fluticasone (FLONASE) 50 MCG/ACT nasal spray Place 1 spray into both nostrils as needed.      hydrocortisone (ANUSOL-HC) 2.5 % rectal cream Place 1 application rectally 2 (two) times daily. (Patient taking differently: Place 1 application rectally as needed. ) 30 g 0   levonorgestrel (MIRENA) 20 MCG/24HR IUD 1 each by Intrauterine route once.     olopatadine (PATANOL) 0.1 % ophthalmic solution Place 1 drop into both eyes as needed.      PEG-KCl-NaCl-NaSulf-Na Asc-C (PLENVU) 140 g SOLR Take 140 g by mouth as directed. Manufacturer's coupon Universal coupon code:BIN: P2366821; GROUP: [id number]; PCN: [id number]; ID: [id number]; PAY NO MORE $50 1 each 0   No current facility-administered medications for this visit.    Allergies as of 08/03/2020   (No Known Allergies)    Family History  Problem Relation Age of Onset   Diabetes Brother    Congestive Heart Failure Father    Healthy Mother    Breast cancer Neg Hx    Ovarian cancer Neg Hx    Colon cancer Neg Hx    Stomach cancer Neg Hx    Pancreatic cancer Neg Hx    Liver disease Neg Hx    Esophageal cancer Neg Hx     Social History   Socioeconomic History   Marital status: Married    Spouse name: Not on file   Number of children: Not on file   Years of education: Not on file   Highest education level: Not on  file  Occupational History   Not on file  Tobacco Use   Smoking status: Never Smoker   Smokeless tobacco: Never Used  Vaping Use   Vaping Use: Never used  Substance and Sexual Activity   Alcohol use: Yes    Comment: rare   Drug use: No   Sexual activity: Yes    Birth control/protection: I.U.D.    Comment: Mirena  Other Topics Concern   Not on file  Social History Narrative   Not on file   Social Determinants of Health   Financial Resource  Strain:    Difficulty of Paying Living Expenses: Not on file  Food Insecurity:    Worried About Charity fundraiser in the Last Year: Not on file   [employer] of Food in the Last Year: Not on file  Transportation Needs:    Lack of Transportation (Medical): Not on file   Lack of Transportation (Non-Medical): Not on file  Physical Activity:    Days of Exercise per Week: Not on file   Minutes of Exercise per Session: Not on file  Stress:    Feeling of Stress : Not on file  Social Connections:    Frequency of Communication with Friends and Family: Not on file   Frequency of Social Gatherings with Friends and Family: Not on file   Attends Religious Services: Not on file   Active Member of Clubs or Organizations: Not on file   Attends Archivist Meetings: Not on file   Marital Status: Not on file  Intimate Partner Violence:    Fear of Current or Ex-Partner: Not on file   Emotionally Abused: Not on file   Physically Abused: Not on file   Sexually Abused: Not on file    Review of Systems: 12 system ROS is negative except as noted above except for anxiety and allergies.   Physical Exam: General:   Alert,  well-nourished, pleasant and cooperative in NAD Head:  Normocephalic and atraumatic. Eyes:  Sclera clear, no icterus.   Conjunctiva pink. Ears:  Normal auditory acuity. Nose:  No deformity, discharge,  or lesions. Mouth:  No deformity or lesions.   Neck:  Supple; no masses or thyromegaly. Lungs:  Clear throughout to auscultation.   No wheezes. Heart:  Regular rate and rhythm; no murmurs. Abdomen:  Soft, nontender, nondistended, normal bowel sounds, no rebound or guarding. No hepatosplenomegaly.   Rectal:   No chemical dermatitis. External hemorrhoid in the anal canal. No fissure or fistula. No prolapsing hemorrhoids. No rectal prolapse. Normal anocutaneous reflex. No stool in the rectal vault. No mass or fecal impaction. Normal anal resting tone. Chaperone:  Ammie Msk:  Symmetrical. No boney deformities LAD: No inguinal or umbilical LAD Extremities:  No clubbing or edema. Neurologic:  Alert and  oriented x4;  grossly nonfocal Skin:  Intact without significant lesions or rashes. Psych:  Alert and cooperative. Normal mood and affect.      Korie Streat L. Tarri Glenn, MD, MPH 08/05/2020, 7:22 PM

## 2020-08-03 NOTE — Patient Instructions (Addendum)
If you are age 44 or older, your body mass index should be between 23-30. Your Body mass index is 25.89 kg/m. If this is out of the aforementioned range listed, please consider follow up with your Primary Care Provider.  If you are age 47 or younger, your body mass index should be between 19-25. Your Body mass index is 25.89 kg/m. If this is out of the aformentioned range listed, please consider follow up with your Primary Care Provider.   Add psyllium or methylcellulose.   You have been scheduled for a colonoscopy. Please follow written instructions given to you at your visit today.  Please pick up your prep supplies at the pharmacy within the next 1-3 days. If you use inhalers (even only as needed), please bring them with you on the day of your procedure.  Thank you for trusting me with your gastrointestinal care!    Thornton Park, MD, MPH

## 2020-08-05 ENCOUNTER — Encounter: Payer: Self-pay | Admitting: Gastroenterology

## 2020-08-05 ENCOUNTER — Other Ambulatory Visit: Payer: Self-pay

## 2020-08-05 ENCOUNTER — Ambulatory Visit
Admission: RE | Admit: 2020-08-05 | Discharge: 2020-08-05 | Disposition: A | Discharge status: Home / Self Care | Payer: Managed Care, Other (non HMO) | Source: Ambulatory Visit | Attending: Certified Nurse Midwife | Admitting: Certified Nurse Midwife

## 2020-08-05 DIAGNOSIS — Z1231 Encounter for screening mammogram for malignant neoplasm of breast: Secondary | ICD-10-CM

## 2020-08-05 DIAGNOSIS — Z01419 Encounter for gynecological examination (general) (routine) without abnormal findings: Secondary | ICD-10-CM

## 2020-08-10 ENCOUNTER — Other Ambulatory Visit: Payer: Self-pay

## 2020-08-10 ENCOUNTER — Ambulatory Visit (AMBULATORY_SURGERY_CENTER): Payer: Managed Care, Other (non HMO) | Admitting: Gastroenterology

## 2020-08-10 ENCOUNTER — Encounter: Payer: Self-pay | Admitting: Gastroenterology

## 2020-08-10 VITALS — BP 102/64 | HR 66 | Temp 96.9°F | Resp 16 | Ht 65.0 in | Wt 155.0 lb

## 2020-08-10 DIAGNOSIS — K635 Polyp of colon: Secondary | ICD-10-CM | POA: Diagnosis not present

## 2020-08-10 DIAGNOSIS — K6289 Other specified diseases of anus and rectum: Secondary | ICD-10-CM | POA: Diagnosis not present

## 2020-08-10 DIAGNOSIS — D122 Benign neoplasm of ascending colon: Secondary | ICD-10-CM

## 2020-08-10 MED ORDER — SODIUM CHLORIDE 0.9 % IV SOLN: 500.0000 mL | Freq: Once | INTRAVENOUS | Status: DC

## 2020-08-10 NOTE — Patient Instructions (Signed)
Thank you for allowing [place] to care for you today!  See procedure report for recommendations to minimize rectal irritation.  Resume previous diet and medications today.  Return to your normal activities tomorrow.    YOU HAD AN ENDOSCOPIC PROCEDURE TODAY AT [facility]:   Refer to the procedure report that was given to you for any specific questions about what was found during the examination.  If the procedure report does not answer your questions, please call your gastroenterologist to clarify.  If you requested that your care partner not be given the details of your procedure findings, then the procedure report has been included in a sealed envelope for you to review at your convenience later.  YOU SHOULD EXPECT: Some feelings of bloating in the abdomen. Passage of more gas than usual.  Walking can help get rid of the air that was put into your GI tract during the procedure and reduce the bloating. If you had a lower endoscopy (such as a colonoscopy or flexible sigmoidoscopy) you may notice spotting of blood in your stool or on the toilet paper. If you underwent a bowel prep for your procedure, you may not have a normal bowel movement for a few days.  Please Note:  You might notice some irritation and congestion in your nose or some drainage.  This is from the oxygen used during your procedure.  There is no need for concern and it should clear up in a day or so.  SYMPTOMS TO REPORT IMMEDIATELY:   Following lower endoscopy (colonoscopy or flexible sigmoidoscopy):  Excessive amounts of blood in the stool  Significant tenderness or worsening of abdominal pains  Swelling of the abdomen that is new, acute  Fever of 100F or higher     For urgent or emergent issues, a gastroenterologist can be reached at any hour by calling [phone]. Do not use MyChart messaging for urgent concerns.    DIET:  We do recommend a small meal at first, but then you may proceed to your  regular diet.  Drink plenty of fluids but you should avoid alcoholic beverages for 24 hours.  ACTIVITY:  You should plan to take it easy for the rest of today and you should NOT DRIVE or use heavy machinery until tomorrow (because of the sedation medicines used during the test).    FOLLOW UP: Our staff will call the number listed on your records 48-72 hours following your procedure to check on you and address any questions or concerns that you may have regarding the information given to you following your procedure. If we do not reach you, we will leave a message.  We will attempt to reach you two times.  During this call, we will ask if you have developed any symptoms of COVID 19. If you develop any symptoms (ie: fever, flu-like symptoms, shortness of breath, cough etc.) before then, please call [phone].  If you test positive for Covid 19 in the 2 weeks post procedure, please call and report this information to [place].    If any biopsies were taken you will be contacted by phone or by letter within the next 1-3 weeks.  Please call us at [phone] if you have not heard about the biopsies in 3 weeks.    SIGNATURES/CONFIDENTIALITY: You and/or your care partner have signed paperwork which will be entered into your electronic medical record.  These signatures attest to the fact that that the information above on your After Visit Summary has  been reviewed and is understood.  Full responsibility of the confidentiality of this discharge information lies with you and/or your care-partner.

## 2020-08-10 NOTE — Op Note (Signed)
[place] Patient Name: Marcia Green Procedure Date: 08/10/2020 12:58 PM MRN: [id number] Endoscopist: Thornton Park MD, MD Age: 44 Referring MD:  Date of Birth: [birth year] Gender: Female Account #: [id number] Procedure:                Colonoscopy Indications:              Rectal pain/irritation Medicines:                Monitored Anesthesia Care Procedure:                Pre-Anesthesia Assessment:                           - Prior to the procedure, a History and Physical                            was performed, and patient medications and                            allergies were reviewed. The patient's tolerance of                            previous anesthesia was also reviewed. The risks                            and benefits of the procedure and the sedation                            options and risks were discussed with the patient.                            All questions were answered, and informed consent                            was obtained. Prior Anticoagulants: The patient has                            taken no previous anticoagulant or antiplatelet                            agents. ASA Grade Assessment: I - A normal, healthy                            patient. After reviewing the risks and benefits,                            the patient was deemed in satisfactory condition to                            undergo the procedure.                           After obtaining informed consent, the colonoscope  was passed under direct vision. Throughout the                            procedure, the patient's blood pressure, pulse, and                            oxygen saturations were monitored continuously. The                            Colonoscope was introduced through the anus and                            advanced to the 3 cm into the ileum. The                            colonoscopy was performed without difficulty. The                             patient tolerated the procedure well. The quality                            of the bowel preparation was good. The terminal                            ileum, ileocecal valve, appendiceal orifice, and                            rectum were photographed. Scope In: 1:21:51 PM Scope Out: 1:37:41 PM Scope Withdrawal Time: 0 hours 12 minutes 55 seconds  Total Procedure Duration: 0 hours 15 minutes 50 seconds  Findings:                 Hemorrhoids were found on perianal exam and                            retroflexed views of the recum.                           The entire examined colonic mucosa appeared normal.                            No evidence for colitis.                           A 2 mm polyp was found in the ascending colon. The                            polyp was sessile. The polyp was removed with a                            piecemeal technique using a cold biopsy forceps.                            Resection and retrieval were complete. Estimated  blood loss was minimal.                           The exam was otherwise without abnormality on                            direct and retroflexion views. Complications:            No immediate complications. Estimated blood loss:                            Minimal. Estimated Blood Loss:     Estimated blood loss was minimal. Impression:               - Internal and external hemorrhoids.                           - One 2 mm polyp in the ascending colon, removed                            piecemeal using a cold biopsy forceps. Resected and                            retrieved.                           - The examination was otherwise normal on direct                            and retroflexion views. Recommendation:           - Patient has a contact number available for                            emergencies. The signs and symptoms of potential                            delayed  complications were discussed with the                            patient. Return to normal activities tomorrow.                            Written discharge instructions were provided to the                            patient.                           - Resume previous diet. Avoid coffee, colas, beer,                            tomatoes, chocolate, tea, and citrus fruits as                            these can exacerbated anal irritation.                           -  Recommendations to minimize irritation: Keep the                            bottom clean, avoid moisture in the anal area,                            apply zinc oxide to the anal area, avoid tight                            fitting clothes, wear cotton underwear.                           - Continue present medications.                           - Await pathology results.                           - Repeat colonoscopy date to be determined after                            pending pathology results are reviewed for                            surveillance.                           - Emerging evidence supports eating a diet of                            fruits, vegetables, grains, calcium, and yogurt                            while reducing red meat and alcohol may reduce the                            risk of colon cancer. Thornton Park MD, MD 08/10/2020 1:49:52 PM This report has been signed electronically.

## 2020-08-10 NOTE — Progress Notes (Signed)
Called to room to assist during endoscopic procedure.  Patient ID and intended procedure confirmed with present staff. Received instructions for my participation in the procedure from the performing physician.  

## 2020-08-10 NOTE — Progress Notes (Signed)
VS by JD  No changes to medical or social hx since previsit.

## 2020-08-12 ENCOUNTER — Telehealth: Payer: Self-pay | Admitting: *Deleted

## 2020-08-12 NOTE — Telephone Encounter (Signed)
No answer post procedure call back. Left message for patient to call with questions or concerns.

## 2020-08-12 NOTE — Telephone Encounter (Signed)
Follow up call made. 

## 2020-08-23 ENCOUNTER — Encounter: Payer: Self-pay | Admitting: Gastroenterology

## 2020-11-17 ENCOUNTER — Ambulatory Visit
Admission: RE | Admit: 2020-11-17 | Discharge: 2020-11-17 | Disposition: A | Discharge status: Home / Self Care | Payer: Managed Care, Other (non HMO) | Source: Ambulatory Visit | Attending: Physician Assistant | Admitting: Physician Assistant

## 2020-11-17 ENCOUNTER — Other Ambulatory Visit: Payer: Self-pay

## 2020-11-17 ENCOUNTER — Ambulatory Visit
Admission: RE | Admit: 2020-11-17 | Discharge: 2020-11-17 | Disposition: A | Discharge status: Home / Self Care | Payer: Managed Care, Other (non HMO) | Attending: Physician Assistant | Admitting: Physician Assistant

## 2020-11-17 ENCOUNTER — Other Ambulatory Visit: Payer: Self-pay | Admitting: Physician Assistant

## 2020-11-17 DIAGNOSIS — R059 Cough, unspecified: Secondary | ICD-10-CM | POA: Insufficient documentation

## 2021-04-21 DIAGNOSIS — F411 Generalized anxiety disorder: Secondary | ICD-10-CM | POA: Insufficient documentation

## 2021-04-21 DIAGNOSIS — F330 Major depressive disorder, recurrent, mild: Secondary | ICD-10-CM | POA: Insufficient documentation

## 2021-05-02 ENCOUNTER — Other Ambulatory Visit: Payer: Self-pay

## 2021-05-02 ENCOUNTER — Encounter: Payer: Self-pay | Admitting: Certified Nurse Midwife

## 2021-05-02 ENCOUNTER — Ambulatory Visit (INDEPENDENT_AMBULATORY_CARE_PROVIDER_SITE_OTHER): Payer: Managed Care, Other (non HMO) | Admitting: Certified Nurse Midwife

## 2021-05-02 VITALS — BP 136/87 | HR 88 | Ht 65.0 in | Wt 158.4 lb

## 2021-05-02 DIAGNOSIS — Z1231 Encounter for screening mammogram for malignant neoplasm of breast: Secondary | ICD-10-CM

## 2021-05-02 DIAGNOSIS — Z01419 Encounter for gynecological examination (general) (routine) without abnormal findings: Secondary | ICD-10-CM | POA: Diagnosis not present

## 2021-05-02 MED ORDER — VITAMIN D (ERGOCALCIFEROL) 1.25 MG (50000 UNIT) PO CAPS: 50000.0000 [IU] | ORAL_CAPSULE | ORAL | 11 refills | Status: DC

## 2021-05-02 NOTE — Patient Instructions (Signed)
[employer]book [employer]in[employer] (14th ed., pp. 574-641). [place], [place]: Elsevier.">  Perimenopause Perimenopause is the normal time of a woman's life when the levels of estrogen, the female hormone produced by the ovaries, begin to decrease. This leads to changes in menstrual periods before they stop completely (menopause). Perimenopause can begin 2-8 years before menopause. During perimenopause,the ovaries may or may not produce an egg and a woman can still become pregnant. What are the causes? This condition is caused by a natural change in hormone levels that happens asyou get older. What increases the risk? This condition is more likely to start at an earlier age if you have certain medical conditions or have undergone treatments, including: A tumor of the pituitary gland in the brain. A disease that affects the ovaries and hormone production. Certain cancer treatments, such as chemotherapy or hormone therapy, or radiation therapy on the pelvis. Heavy smoking and excessive alcohol use. Family history of early menopause. What are the signs or symptoms? Perimenopausal changes affect each woman differently. Symptoms of this condition may include: Hot flashes. Irregular menstrual periods. Night sweats. Changes in feelings about sex. This could be a decrease in sex drive or an increased discomfort around your sexuality. Vaginal dryness. Headaches. Mood swings. Depression. Problems sleeping (insomnia). Memory problems or trouble concentrating. Irritability. Tiredness. Weight gain. Anxiety. Trouble getting pregnant. How is this diagnosed? This condition is diagnosed based on your medical history, a physical exam, your age, your menstrual history, and your symptoms. Hormone tests may also bedone. How is this treated? In some cases, no treatment is needed. You and your health care provider should make a decision together about whether treatment is necessary. Treatment will be based  on your individual condition and preferences. Various treatments are available, such as: Menopausal hormone therapy (MHT). Medicines to treat specific symptoms. Acupuncture. Vitamin or herbal supplements. Before starting treatment, make sure to let your health care provider know if you have a personal or family history of: Heart disease. Breast cancer. Blood clots. Diabetes. Osteoporosis. Follow these instructions at home: Medicines Take over-the-counter and prescription medicines only as told by your health care provider. Take vitamin supplements only as told by your health care provider. Talk with your health care provider before starting any herbal supplements. Lifestyle  Do not use any products that contain nicotine or tobacco, such as cigarettes, e-cigarettes, and chewing tobacco. If you need help quitting, ask your health care provider. Get at least 30 minutes of physical activity on 5 or more days each week. Eat a balanced diet that includes fresh fruits and vegetables, whole grains, soybeans, eggs, lean meat, and low-fat dairy. Avoid alcoholic and caffeinated beverages, as well as spicy foods. This may help prevent hot flashes. Get 7-8 hours of sleep each night. Dress in layers that can be removed to help you manage hot flashes. Find ways to manage stress, such as deep breathing, meditation, or journaling.  General instructions  Keep track of your menstrual periods, including: When they occur. How heavy they are and how long they last. How much time passes between periods. Keep track of your symptoms, noting when they start, how often you have them, and how long they last. Use vaginal lubricants or moisturizers to help with vaginal dryness and improve comfort during sex. You can still become pregnant if you are having irregular periods. Make sure you use contraception during perimenopause if you do not want to get pregnant. Keep all follow-up visits. This is important. This  includes any group therapy   or counseling.  Contact a health care provider if: You have heavy vaginal bleeding or pass blood clots. Your period lasts more than 2 days longer than normal. Your periods are recurring sooner than 21 days. You bleed after having sex. You have pain during sex. Get help right away if you have: Chest pain, trouble breathing, or trouble talking. Severe depression. Pain when you urinate. Severe headaches. Vision problems. Summary Perimenopause is the time when a woman's body begins to move into menopause. This may happen naturally or as a result of other health problems or medical treatments. Perimenopause can begin 2-8 years before menopause, and it can last for several years. Perimenopausal symptoms can be managed through medicines, lifestyle changes, and complementary therapies such as acupuncture. This information is not intended to replace advice given to you by your health care provider. Make sure you discuss any questions you have with your healthcare provider. Document Revised: 03/17/2020 Document Reviewed: 03/17/2020 Elsevier Patient Education  2022 [employer] Inc.  

## 2021-05-02 NOTE — Progress Notes (Signed)
GYNECOLOGY ANNUAL PREVENTATIVE CARE ENCOUNTER NOTE  History:     Marcia Green is a 45 y.o. G34P2002 female here for a routine annual gynecologic exam.  Current complaints: night sweats. Headaches -cluster after alcohol use.   Denies abnormal vaginal bleeding, discharge, pelvic pain, problems with intercourse or other gynecologic concerns.     Social Relationship: married  Living: with spouse and step daughter Work: at home /[employer] Exercise: 3x wk x co min Smoke/Alcohol/drug use: rare alcohol use, denies smoking and drug use.   Gynecologic History No LMP recorded. (Menstrual status: IUD). Contraception: IUD Last Pap: 03/04/2018. Results were: normal with negative HPV Last mammogram: 08/08/2020. Results were: normal   The pregnancy intention screening data noted above was reviewed. Potential methods of contraception were discussed. The patient elected to proceed with IUD.  Obstetric History OB History  Gravida Para Term Preterm AB Living  2 2 2     2   SAB IAB Ectopic Multiple Live Births          2    # Outcome Date GA Lbr Len/2nd Weight Sex Delivery Anes PTL Lv  2 Term 07/22/00   8 lb 1 oz (3.657 kg) M Vag-Spont None N LIV  1 Term 05/03/95   7 lb 1 oz (3.204 kg) M Vag-Spont None N LIV    Past Medical History:  Diagnosis Date   Anxiety    Depression    Fatigue    Heavy periods    Migraines    UTI (lower urinary tract infection)    Vitamin D deficiency     Past Surgical History:  Procedure Laterality Date   TONSILLECTOMY     WISDOM TOOTH EXTRACTION      Current Outpatient Medications on File Prior to Visit  Medication Sig Dispense Refill   cetirizine (ZYRTEC) 10 MG tablet Take 10 mg by mouth as needed.      fluticasone (FLONASE) 50 MCG/ACT nasal spray Place 1 spray into both nostrils as needed.      hydrocortisone (ANUSOL-HC) 2.5 % rectal cream Place 1 application rectally 2 (two) times daily. 30 g 0   levonorgestrel (MIRENA) 20 MCG/24HR IUD 1 each by  Intrauterine route once.     olopatadine (PATANOL) 0.1 % ophthalmic solution Place 1 drop into both eyes as needed.      No current facility-administered medications on file prior to visit.    No Known Allergies  Social History:  reports that she has never smoked. She has never used smokeless tobacco. She reports current alcohol use. She reports that she does not use drugs.  Family History  Problem Relation Age of Onset   Diabetes Brother    Congestive Heart Failure Father    Healthy Mother    Breast cancer Neg Hx    Ovarian cancer Neg Hx    Colon cancer Neg Hx    Stomach cancer Neg Hx    Pancreatic cancer Neg Hx    Liver disease Neg Hx    Esophageal cancer Neg Hx     The following portions of the patient's history were reviewed and updated as appropriate: allergies, current medications, past family history, past medical history, past social history, past surgical history and problem list.  Review of Systems Pertinent items noted in HPI and remainder of comprehensive ROS otherwise negative.  Physical Exam:  BP 136/87   Pulse 88   Ht 5\' 5"  (1.651 m)   Wt 158 lb 6.4 oz (71.8 kg)  BMI 26.36 kg/m  CONSTITUTIONAL: Well-developed, well-nourished female in no acute distress.  HENT:  Normocephalic, atraumatic, External right and left ear normal. Oropharynx is clear and moist EYES: Conjunctivae and EOM are normal. Pupils are equal, round, and reactive to light. No scleral icterus.  NECK: Normal range of motion, supple, no masses.  Normal thyroid.  SKIN: Skin is warm and dry. No rash noted. Not diaphoretic. No erythema. No pallor. MUSCULOSKELETAL: Normal range of motion. No tenderness.  No cyanosis, clubbing, or edema.  2+ distal pulses. NEUROLOGIC: Alert and oriented to person, place, and time. Normal reflexes, muscle tone coordination.  PSYCHIATRIC: Normal mood and affect. Normal behavior. Normal judgment and thought content. CARDIOVASCULAR: Normal heart rate noted, regular  rhythm RESPIRATORY: Clear to auscultation bilaterally. Effort and breath sounds normal, no problems with respiration noted. BREASTS: Symmetric in size. No masses, tenderness, skin changes, nipple drainage, or lymphadenopathy bilaterally.  ABDOMEN: Soft, no distention noted.  No tenderness, rebound or guarding.  PELVIC: Normal appearing external genitalia and urethral meatus; normal appearing vaginal mucosa and cervix.  Strings present. No abnormal discharge noted.  Pap smear not due.  Normal uterine size, no other palpable masses, no uterine or adnexal tenderness.  .   Assessment and Plan:    1. Well woman exam with routine gynecological exam   Pap: not due Mammogram : ordered Labs: none  Refills:Vitamin D Referral:none Discussed perimenopause and normal changes. Self help measures discussed. [place] given with lifestyle changes. Encouraged to try these measures first. If not helping can re evaluate for HRT. She verbalizes and agrees to plan of care.  Routine preventative health maintenance measures emphasized. Please refer to After Visit Summary for other counseling recommendations.      Philip Aspen, CNM [facility]

## 2021-08-07 ENCOUNTER — Ambulatory Visit
Admission: RE | Admit: 2021-08-07 | Discharge: 2021-08-07 | Disposition: A | Discharge status: Home / Self Care | Payer: Managed Care, Other (non HMO) | Source: Ambulatory Visit | Attending: Certified Nurse Midwife | Admitting: Certified Nurse Midwife

## 2021-08-07 ENCOUNTER — Other Ambulatory Visit: Payer: Self-pay

## 2021-08-07 DIAGNOSIS — Z01419 Encounter for gynecological examination (general) (routine) without abnormal findings: Secondary | ICD-10-CM | POA: Insufficient documentation

## 2021-08-07 DIAGNOSIS — Z1231 Encounter for screening mammogram for malignant neoplasm of breast: Secondary | ICD-10-CM | POA: Diagnosis present

## 2021-09-05 ENCOUNTER — Encounter: Payer: Self-pay | Admitting: Certified Nurse Midwife

## 2022-05-14 ENCOUNTER — Other Ambulatory Visit: Payer: Self-pay | Admitting: Otolaryngology

## 2022-05-14 DIAGNOSIS — R220 Localized swelling, mass and lump, head: Secondary | ICD-10-CM

## 2022-05-14 DIAGNOSIS — J342 Deviated nasal septum: Secondary | ICD-10-CM

## 2022-05-18 ENCOUNTER — Ambulatory Visit
Admission: RE | Admit: 2022-05-18 | Discharge: 2022-05-18 | Disposition: A | Discharge status: Home / Self Care | Payer: Managed Care, Other (non HMO) | Source: Ambulatory Visit | Attending: Otolaryngology | Admitting: Otolaryngology

## 2022-05-18 DIAGNOSIS — R220 Localized swelling, mass and lump, head: Secondary | ICD-10-CM

## 2022-05-18 DIAGNOSIS — J342 Deviated nasal septum: Secondary | ICD-10-CM

## 2022-07-26 ENCOUNTER — Other Ambulatory Visit: Payer: Self-pay | Admitting: Family Medicine

## 2022-07-26 DIAGNOSIS — Z1231 Encounter for screening mammogram for malignant neoplasm of breast: Secondary | ICD-10-CM

## 2022-08-16 ENCOUNTER — Ambulatory Visit
Admission: EM | Admit: 2022-08-16 | Discharge: 2022-08-16 | Disposition: A | Discharge status: Home / Self Care | Payer: Managed Care, Other (non HMO) | Attending: Urgent Care | Admitting: Urgent Care

## 2022-08-16 DIAGNOSIS — R6889 Other general symptoms and signs: Secondary | ICD-10-CM

## 2022-08-16 DIAGNOSIS — U071 COVID-19: Secondary | ICD-10-CM | POA: Diagnosis present

## 2022-08-16 DIAGNOSIS — J069 Acute upper respiratory infection, unspecified: Secondary | ICD-10-CM

## 2022-08-16 LAB — RESP PANEL BY RT-PCR (RSV, FLU A&B, COVID)  RVPGX2
Influenza A by PCR: NEGATIVE
Influenza B by PCR: NEGATIVE
Resp Syncytial Virus by PCR: NEGATIVE
SARS Coronavirus 2 by RT PCR: POSITIVE — AB

## 2022-08-16 MED ORDER — HYDROCOD POLI-CHLORPHE POLI ER 10-8 MG/5ML PO SUER: 5.0000 mL | Freq: Two times a day (BID) | ORAL | 0 refills | Status: AC | PRN

## 2022-08-16 NOTE — ED Triage Notes (Signed)
Pt. Presents to UC w/ c/o body aches, chills, and nasal congestion for the past 3 days. Pt. Expresses concern for the Flu. Pt. States Monday she had a fever of 100.36F.

## 2022-08-16 NOTE — Discharge Instructions (Signed)
Follow up here or with your primary care provider if your symptoms are worsening or not improving.     

## 2022-08-16 NOTE — ED Provider Notes (Signed)
[facility]    CSN: [id number] Arrival date & time: 08/16/22  1007      History   Chief Complaint Chief Complaint  Patient presents with   Fever   Generalized Body Aches   Chills   Nasal Congestion    HPI MARYBELLA ETHIER is a 46 y.o. female.    Fever   Presents to UC with complaint of symptoms x3 days, including body aches, chills, fever, nasal congestion.  Using OTC medications which are not managing her symptoms adequately.  Nighttime cough and congestion is her most troublesome symptom, not managed by Advil PM.  Denies SOB or dyspnea.  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 Authorizing Provider  cyclobenzaprine (FLEXERIL) 10 MG tablet Take by mouth. 02/28/21  Yes [provider]  escitalopram (LEXAPRO) 10 MG tablet Take 1 tablet by mouth daily. 07/26/22  Yes [provider]  predniSONE (STERAPRED UNI-PAK 21 TAB) 10 MG (21) TBPK tablet Take by mouth. 05/21/22  Yes [provider]  tobramycin-dexamethasone (TOBRADEX) ophthalmic solution INSTILL 1 DROP INTO RIGHT EYE 4X/DAY X 1 WEEK AS DIRECTED. 10/10/19  Yes [provider]  acetaminophen (TYLENOL) 500 MG tablet Take by mouth.     [provider]  Azelastine HCl 137 MCG/SPRAY SOLN Place 1 spray into both nostrils 2 (two) times daily.    [provider]  cetirizine (ZYRTEC) 10 MG tablet Take 10 mg by mouth as needed.     [provider]  fluticasone (FLONASE) 50 MCG/ACT nasal spray Place 1 spray into both nostrils as needed.     [provider]  hydrocortisone (ANUSOL-HC) 2.5 % rectal cream Place 1 application rectally 2 (two) times daily. 05/17/20   Philip Aspen, CNM  hydrOXYzine (ATARAX) 25 MG tablet Take by mouth.    [provider]  levonorgestrel (MIRENA) 20 MCG/24HR IUD 1 each by Intrauterine route once.    [provider]  olopatadine (PATANOL) 0.1 % ophthalmic solution Place 1 drop into both eyes as needed.  07/31/18   [provider]  Vitamin D, Ergocalciferol, (DRISDOL) 1.25 MG (50000 UNIT) CAPS capsule Take 1 capsule (50,000 Units total) by mouth every 7 (seven) days. 05/02/21   Philip Aspen, CNM    Family History Family History  Problem Relation Age of Onset   Diabetes Brother    Congestive  Heart Failure Father    Healthy Mother    Breast cancer Neg Hx    Ovarian cancer Neg Hx    Colon cancer Neg Hx    Stomach cancer Neg Hx    Pancreatic cancer Neg Hx    Liver disease Neg Hx    Esophageal cancer Neg Hx     Social History Social History   Tobacco Use   Smoking status: Never   Smokeless tobacco: Never  Vaping Use   Vaping Use: Never used  Substance Use Topics   Alcohol use: Yes    Comment: occas   Drug use: No     Allergies   Patient has no known allergies.   Review of Systems Review of Systems  Constitutional:  Positive for fever.     Physical Exam Triage Vital Signs ED Triage Vitals  Enc Vitals Group     BP 08/16/22 1018 117/83     Pulse Rate 08/16/22 1018 84     Resp 08/16/22 1018 18     Temp 08/16/22 1018 98.3 F (36.8 C)     Temp src --      SpO2 08/16/22 1018 95 %     Weight --      Height --       Head Circumference --      Peak Flow --      Pain Score 08/16/22 1019 3     Pain Loc --      Pain Edu? --      Excl. in [place]? --    No data found.  Updated Vital Signs BP 117/83   Pulse 84   Temp 98.3 F (36.8 C)   Resp 18   SpO2 95%   Visual Acuity Right Eye Distance:   Left Eye Distance:   Bilateral Distance:    Right Eye Near:   Left Eye Near:    Bilateral Near:     Physical Exam Vitals reviewed.  Constitutional:      Appearance: Normal appearance.  HENT:     Mouth/Throat:     Mouth: Mucous membranes are moist.     Pharynx: Posterior oropharyngeal erythema present. No oropharyngeal exudate.  Eyes:     Conjunctiva/sclera: Conjunctivae normal.     Pupils: Pupils are equal, round, and reactive to light.  Cardiovascular:     Rate and Rhythm: Normal rate and regular rhythm.     Pulses: Normal pulses.     Heart sounds: Normal heart sounds.  Pulmonary:     Effort: Pulmonary effort is normal.     Breath sounds: Normal breath sounds.  Skin:    General: Skin is warm and dry.  Neurological:     General: No focal deficit present.     Mental Status: She is alert and oriented to person, place, and time.  Psychiatric:        Mood and Affect: Mood normal.        Behavior: Behavior normal.      UC Treatments / Results  Labs (all labs ordered are listed, but only abnormal results are displayed) Labs Reviewed - No data to display  EKG   Radiology No results found.  Procedures Procedures (including critical care time)  Medications Ordered in UC Medications - No data to display  Initial Impression / Assessment and Plan / UC Course  I have reviewed the triage vital signs and the nursing notes.  Pertinent labs & imaging results that were available during my care of the  patient were reviewed by me and considered in my medical decision making (see chart for details).   Suspect viral URI with cough, including influenza/COVID.  Respiratory swab is obtained and  pending.  Lungs CTA B, pharyngeal erythema is present without peritonsillar exudates.  Will prescribe Tussionex for nighttime cough.  She can use during the day as well but advised her regarding sedation.  Otherwise will use OTC medication for symptom control.   Final Clinical Impressions(s) / UC Diagnoses   Final diagnoses:  None   Discharge Instructions   None    ED Prescriptions   None    PDMP not reviewed this encounter.   Rose Phi, [place] 08/16/22 1036

## 2022-08-29 ENCOUNTER — Ambulatory Visit
Admission: RE | Admit: 2022-08-29 | Discharge: 2022-08-29 | Disposition: A | Discharge status: Home / Self Care | Payer: Managed Care, Other (non HMO) | Source: Ambulatory Visit | Attending: Family Medicine | Admitting: Family Medicine

## 2022-08-29 DIAGNOSIS — Z1231 Encounter for screening mammogram for malignant neoplasm of breast: Secondary | ICD-10-CM | POA: Diagnosis not present

## 2022-09-03 ENCOUNTER — Other Ambulatory Visit: Payer: Self-pay | Admitting: Family Medicine

## 2022-09-03 DIAGNOSIS — R921 Mammographic calcification found on diagnostic imaging of breast: Secondary | ICD-10-CM

## 2022-09-03 DIAGNOSIS — R928 Other abnormal and inconclusive findings on diagnostic imaging of breast: Secondary | ICD-10-CM

## 2022-09-03 DIAGNOSIS — N63 Unspecified lump in unspecified breast: Secondary | ICD-10-CM

## 2022-09-20 ENCOUNTER — Ambulatory Visit
Admission: RE | Admit: 2022-09-20 | Discharge: 2022-09-20 | Disposition: A | Discharge status: Home / Self Care | Payer: Managed Care, Other (non HMO) | Source: Ambulatory Visit | Attending: Family Medicine | Admitting: Family Medicine

## 2022-09-20 DIAGNOSIS — R921 Mammographic calcification found on diagnostic imaging of breast: Secondary | ICD-10-CM | POA: Insufficient documentation

## 2022-09-20 DIAGNOSIS — R928 Other abnormal and inconclusive findings on diagnostic imaging of breast: Secondary | ICD-10-CM

## 2022-09-20 DIAGNOSIS — N63 Unspecified lump in unspecified breast: Secondary | ICD-10-CM | POA: Insufficient documentation

## 2023-02-11 ENCOUNTER — Other Ambulatory Visit: Payer: Self-pay | Admitting: Family Medicine

## 2023-02-11 DIAGNOSIS — R921 Mammographic calcification found on diagnostic imaging of breast: Secondary | ICD-10-CM

## 2023-03-25 ENCOUNTER — Ambulatory Visit
Admission: RE | Admit: 2023-03-25 | Discharge: 2023-03-25 | Disposition: A | Discharge status: Home / Self Care | Payer: Managed Care, Other (non HMO) | Source: Ambulatory Visit | Attending: Family Medicine | Admitting: Family Medicine

## 2023-03-25 DIAGNOSIS — R921 Mammographic calcification found on diagnostic imaging of breast: Secondary | ICD-10-CM | POA: Insufficient documentation

## 2023-03-26 ENCOUNTER — Encounter: Payer: Self-pay | Admitting: Family Medicine

## 2023-03-27 ENCOUNTER — Other Ambulatory Visit: Payer: Self-pay | Admitting: Family Medicine

## 2023-03-27 DIAGNOSIS — R921 Mammographic calcification found on diagnostic imaging of breast: Secondary | ICD-10-CM

## 2023-03-27 DIAGNOSIS — R928 Other abnormal and inconclusive findings on diagnostic imaging of breast: Secondary | ICD-10-CM

## 2023-04-02 ENCOUNTER — Ambulatory Visit
Admission: RE | Admit: 2023-04-02 | Discharge: 2023-04-02 | Disposition: A | Discharge status: Home / Self Care | Payer: Managed Care, Other (non HMO) | Source: Ambulatory Visit | Attending: Family Medicine | Admitting: Family Medicine

## 2023-04-02 DIAGNOSIS — R928 Other abnormal and inconclusive findings on diagnostic imaging of breast: Secondary | ICD-10-CM | POA: Insufficient documentation

## 2023-04-02 DIAGNOSIS — R921 Mammographic calcification found on diagnostic imaging of breast: Secondary | ICD-10-CM | POA: Diagnosis present

## 2023-04-02 HISTORY — PX: BREAST BIOPSY: SHX20

## 2023-04-02 MED ORDER — LIDOCAINE-EPINEPHRINE 1 %-1:100000 IJ SOLN
20.0000 mL | Freq: Once | INTRAMUSCULAR | Status: AC
  Administered 2023-04-02: 20 mL
  Filled 2023-04-02: qty 20

## 2023-04-02 MED ORDER — LIDOCAINE HCL 1 % IJ SOLN
5.0000 mL | Freq: Once | INTRAMUSCULAR | Status: AC
  Administered 2023-04-02: 5 mL
  Filled 2023-04-02: qty 5

## 2023-07-08 IMAGING — MG MM DIGITAL SCREENING BILAT W/ TOMO AND CAD
8 series · 9 of 24 positions shown · non-contrast
Comparison: Previous exam(s).

CLINICAL DATA: Screening.

EXAM:
DIGITAL SCREENING BILATERAL MAMMOGRAM WITH TOMOSYNTHESIS AND CAD
TECHNIQUE: Bilateral screening digital craniocaudal and mediolateral oblique
mammograms were obtained. Bilateral screening digital breast
tomosynthesis was performed. The images were evaluated with
computer-aided detection.

[L CC synth-2D]
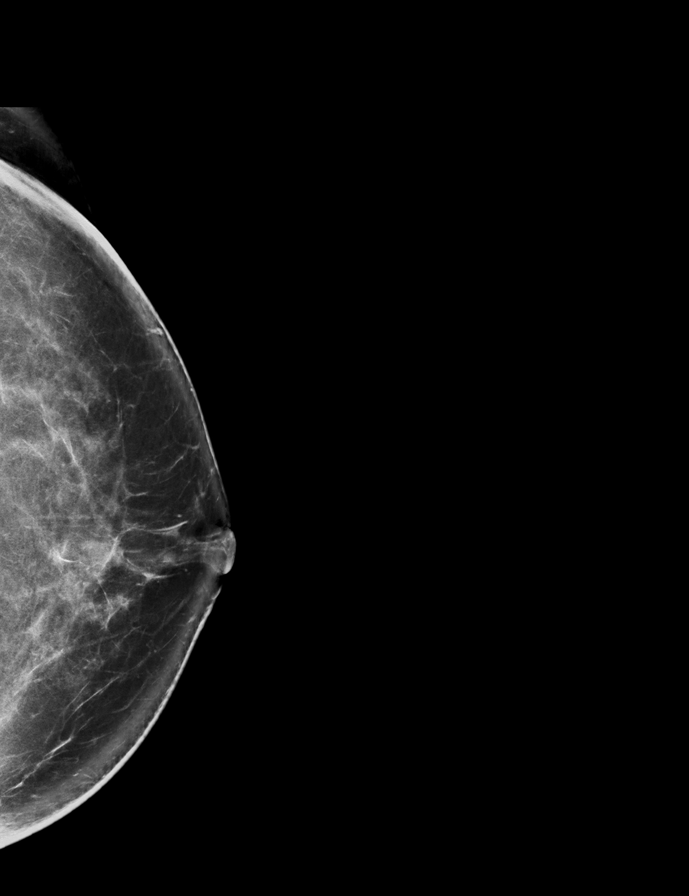

[L MLO synth-2D]
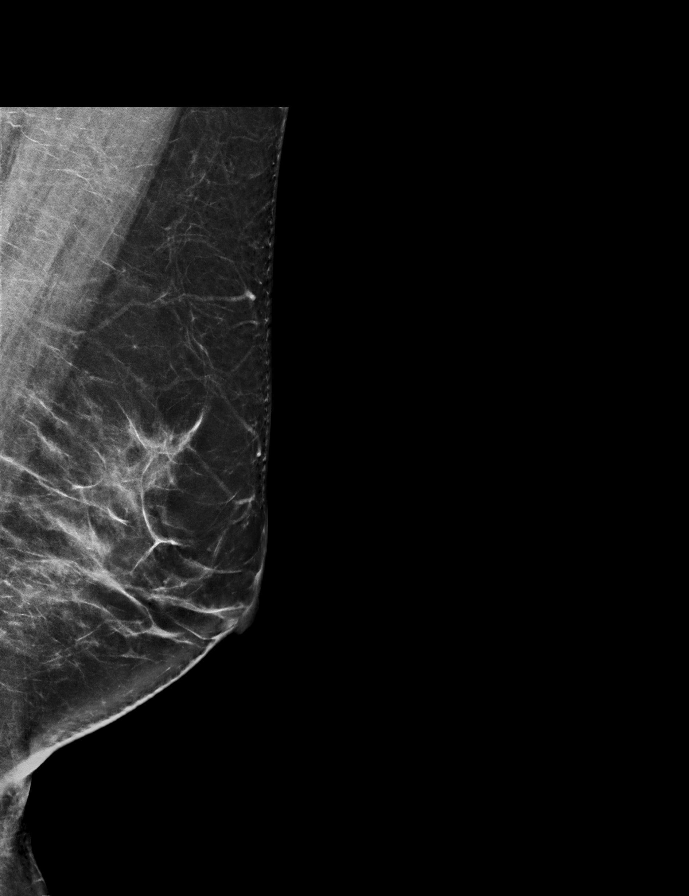

[R CC synth-2D]
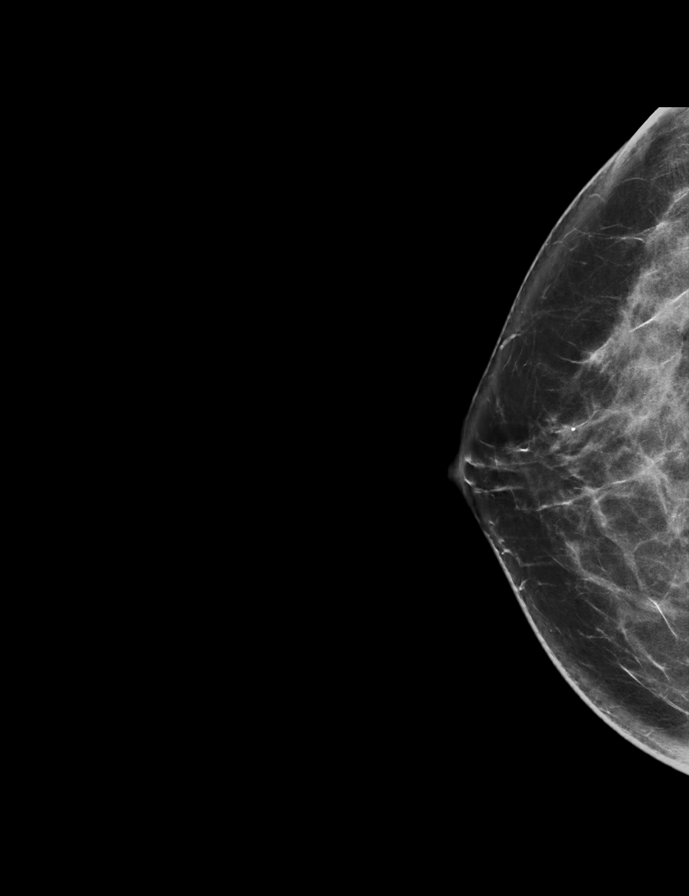

[R MLO synth-2D]
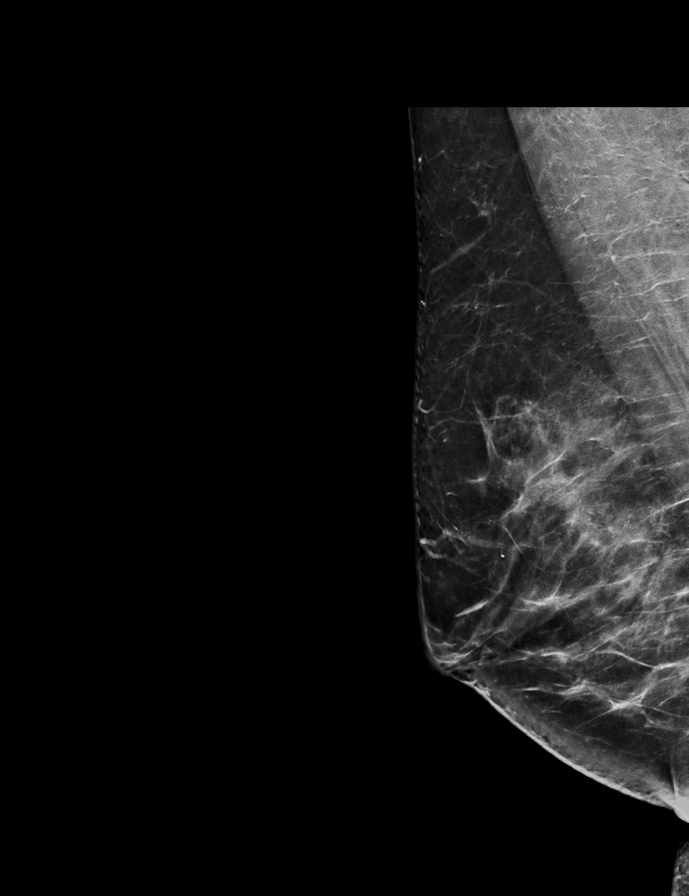

[L CC tomo · 2 of 88 frames shown]
[frame 29/88]
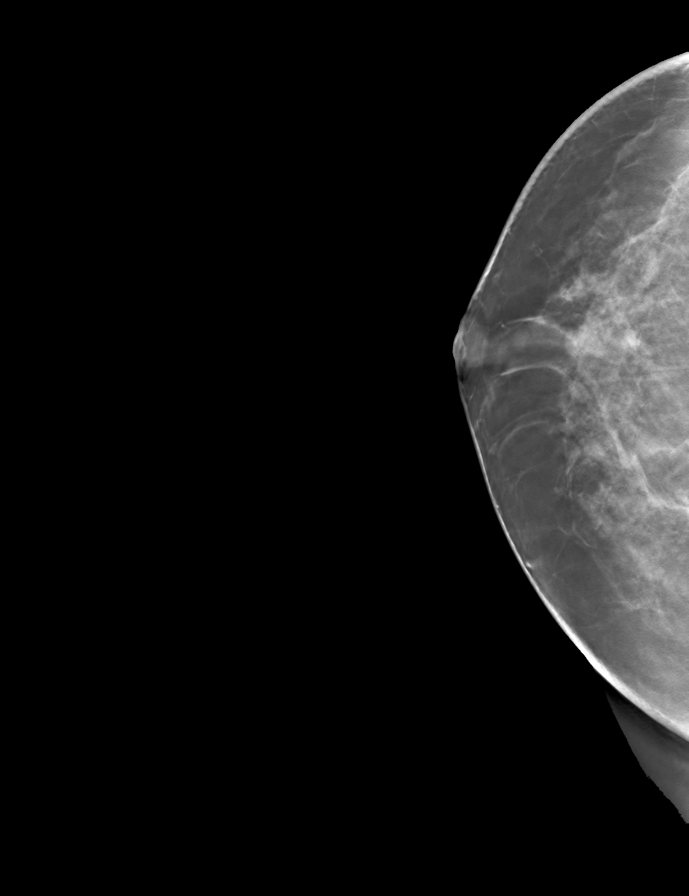
[frame 45/88]
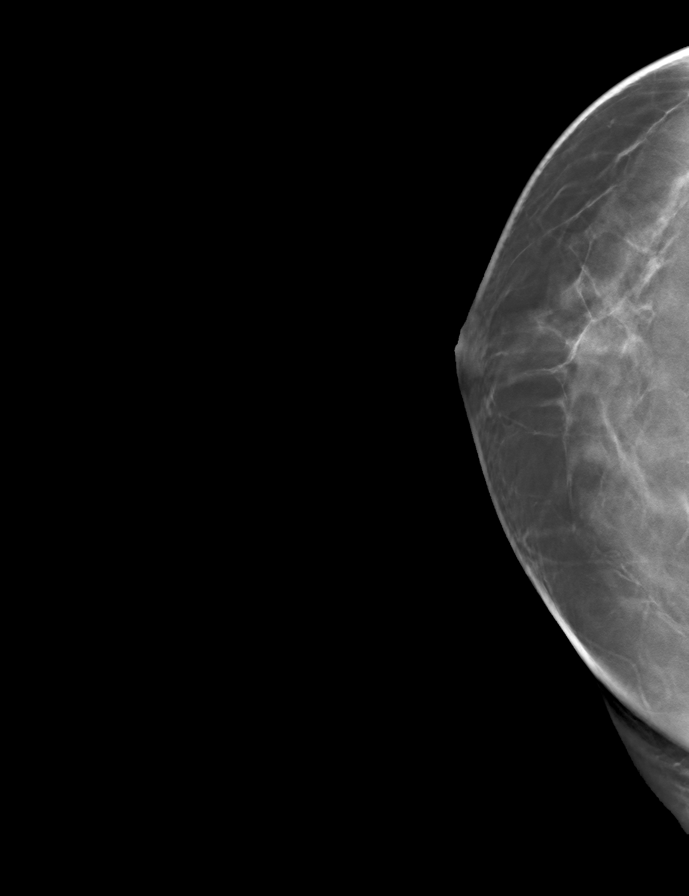

[L MLO tomo · tomo slice 37/74.0]
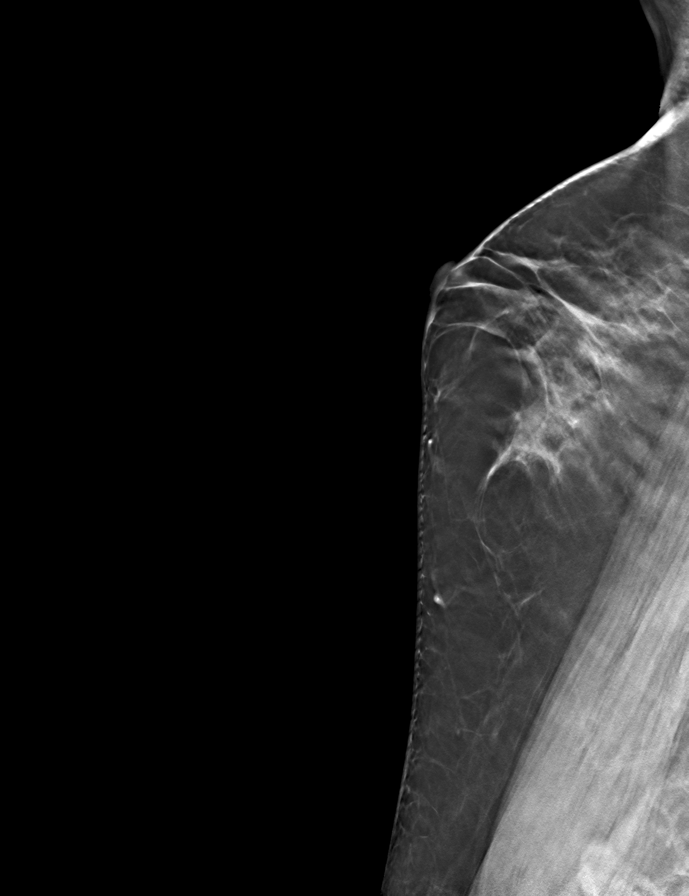

[R CC tomo · tomo slice 38/75.0]
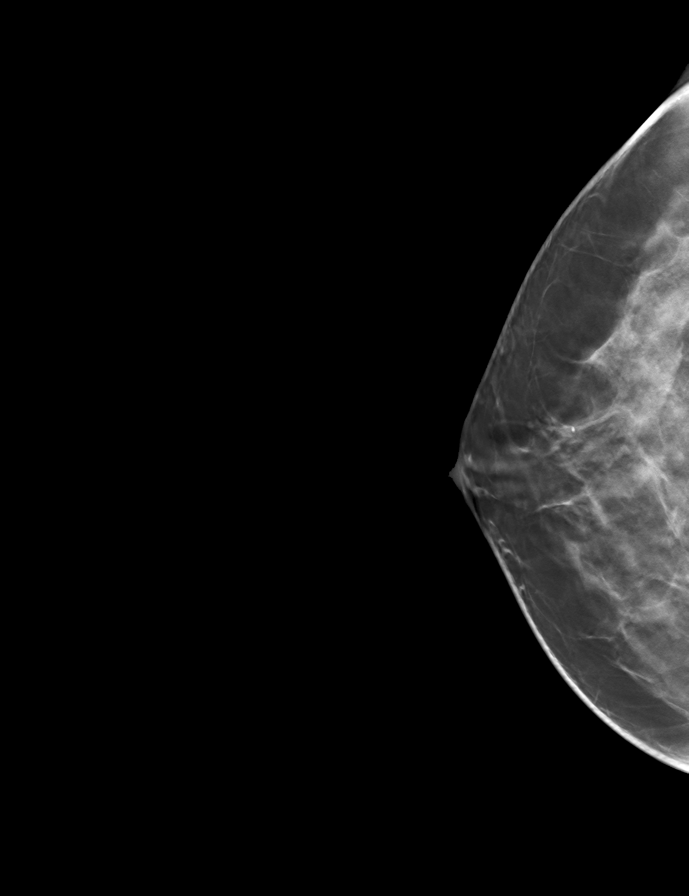

[R MLO tomo · tomo slice 38/75.0]
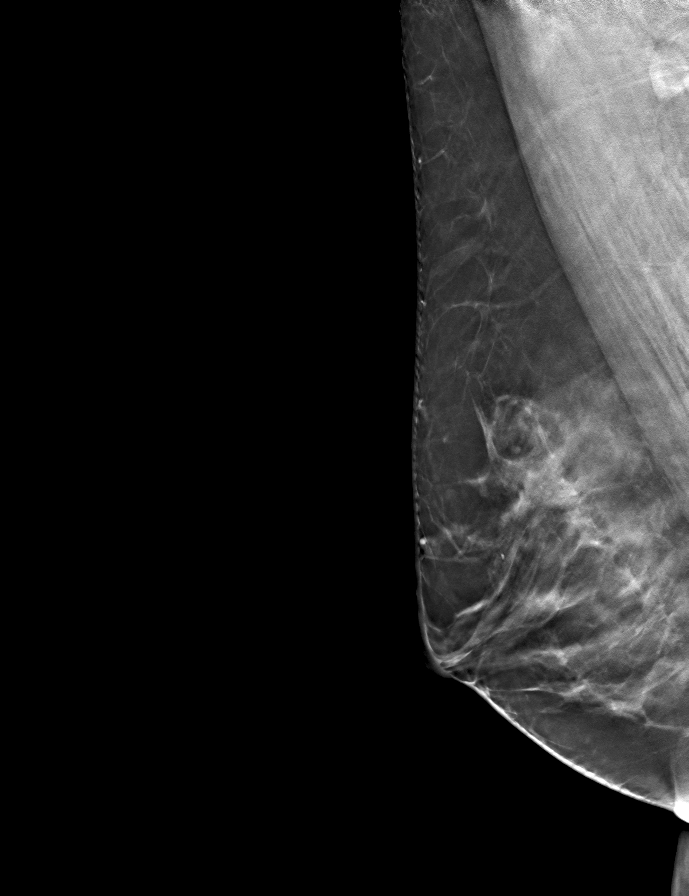

[9 of 24 positions shown; findings below may reference images not displayed]

ACR Breast Density Category c: The breast tissue is heterogeneously
dense, which may obscure small masses.
FINDINGS: There are no findings suspicious for malignancy.
IMPRESSION: No mammographic evidence of malignancy. A result letter of this
screening mammogram will be mailed directly to the patient.

RECOMMENDATION:
Screening mammogram in one year. (Code:Q3-W-BC3)

BI-RADS CATEGORY  1: Negative.

## 2023-10-13 ENCOUNTER — Ambulatory Visit
Admission: RE | Admit: 2023-10-13 | Discharge: 2023-10-13 | Disposition: A | Discharge status: Home / Self Care | Payer: Managed Care, Other (non HMO) | Source: Ambulatory Visit | Attending: Emergency Medicine | Admitting: Emergency Medicine

## 2023-10-13 VITALS — BP 131/86 | HR 81 | Temp 98.3°F | Resp 18

## 2023-10-13 DIAGNOSIS — J0100 Acute maxillary sinusitis, unspecified: Secondary | ICD-10-CM | POA: Diagnosis not present

## 2023-10-13 MED ORDER — AMOXICILLIN-POT CLAVULANATE 875-125 MG PO TABS: 1.0000 | ORAL_TABLET | Freq: Two times a day (BID) | ORAL | 0 refills | Status: DC

## 2023-10-13 NOTE — ED Triage Notes (Signed)
Patient to Urgent Care with complaints of headaches/ nasal congestion/ fatigue. Reports feeling febrile w/ no fevers. Facial pain pain/ pressure.  Symptoms started three weeks ago.  Meds: Dayquil/ Nyquil.

## 2023-10-13 NOTE — ED Provider Notes (Signed)
Marcia Green    CSN: [id number] Arrival date & time: 10/13/23  1055      History   Chief Complaint Chief Complaint  Patient presents with   Facial Pain    HPI Marcia Green is a 47 y.o. female.  Patient presents with 3-week history of sinus pressure, sinus congestion, headache, mild cough.  Treatment attempted with DayQuil and NyQuil.  No fever or shortness of breath.  The history is provided by the patient and medical records.    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   Grade I hemorrhoids 05/03/2020   Vitamin D deficiency 10/05/2015    Past Surgical History:  Procedure Laterality Date   BREAST BIOPSY Right 04/02/2023   stereo bx, calcs, COIL clip-path pending   BREAST BIOPSY Right 04/02/2023   MM RT BREAST BX W LOC DEV 1ST LESION IMAGE BX SPEC STEREO GUIDE 04/02/2023 [facility]   TONSILLECTOMY     WISDOM TOOTH EXTRACTION      OB History     Gravida  2   Para  2   Term  2   Preterm      AB      Living  2      SAB      IAB      Ectopic      Multiple      Live Births  2            Home Medications    Prior to Admission medications   Medication Sig Start Date End Date Taking? Authorizing Provider  amoxicillin-clavulanate (AUGMENTIN) 875-125 MG tablet Take 1 tablet by mouth every 12 (twelve) hours. 10/13/23  Yes Mickie Bail, NP  acetaminophen (TYLENOL) 500 MG tablet Take by mouth.    [provider]  Azelastine HCl 137 MCG/SPRAY SOLN Place 1 spray into both nostrils 2 (two) times daily.    [provider]  cetirizine (ZYRTEC) 10 MG tablet Take 10 mg by mouth as needed.     [provider]   cyclobenzaprine (FLEXERIL) 10 MG tablet Take by mouth. Patient not taking: Reported on 10/13/2023 02/28/21   [provider]  escitalopram (LEXAPRO) 10 MG tablet Take 1 tablet by mouth daily. 07/26/22   [provider]  fluticasone (FLONASE) 50 MCG/ACT nasal spray Place 1 spray into both nostrils as needed.     [provider]  hydrocortisone (ANUSOL-HC) 2.5 % rectal cream Place 1 application rectally 2 (two) times daily. Patient not taking: Reported on 10/13/2023 05/17/20   Doreene Burke, CNM  hydrOXYzine (ATARAX) 25 MG tablet Take by mouth.    [provider]  levonorgestrel (MIRENA) 20 MCG/24HR IUD 1 each by Intrauterine route once.    [provider]  olopatadine (PATANOL) 0.1 % ophthalmic solution Place 1 drop into both eyes as needed.  Patient not taking: Reported on 10/13/2023 07/31/18   [provider]  predniSONE (STERAPRED UNI-PAK 21 TAB) 10 MG (21) TBPK tablet Take by mouth. Patient not taking: Reported on 10/13/2023 05/21/22   [provider]  tobramycin-dexamethasone Wallene Dales) ophthalmic solution INSTILL 1 DROP INTO RIGHT EYE 4X/DAY  X 1 WEEK AS DIRECTED. Patient not taking: Reported on 10/13/2023 10/10/19   [provider]  Vitamin D, Ergocalciferol, (DRISDOL) 1.25 MG (50000 UNIT) CAPS capsule Take 1 capsule (50,000 Units total) by mouth every 7 (seven) days. 05/02/21   Doreene Burke, CNM    Family History Family History  Problem Relation Age of Onset   Diabetes Brother    Congestive Heart Failure Father    Healthy Mother    Breast cancer Neg Hx    Ovarian cancer Neg Hx    Colon cancer Neg Hx    Stomach cancer Neg Hx    Pancreatic cancer Neg Hx    Liver disease Neg Hx    Esophageal cancer Neg Hx     Social History Social History   Tobacco Use   Smoking status: Never   Smokeless tobacco: Never  Vaping Use   Vaping status: Never Used  Substance Use Topics   Alcohol use: Yes    Comment: occas    Drug use: No     Allergies   Patient has no known allergies.   Review of Systems Review of Systems  Constitutional:  Negative for chills and fever.  HENT:  Positive for congestion, postnasal drip, rhinorrhea, sinus pressure and sinus pain. Negative for ear pain and sore throat.   Respiratory:  Positive for cough. Negative for shortness of breath.      Physical Exam Triage Vital Signs ED Triage Vitals  Encounter Vitals Group     BP 10/13/23 1108 131/86     Systolic BP Percentile --      Diastolic BP Percentile --      Pulse Rate 10/13/23 1108 81     Resp 10/13/23 1108 18     Temp 10/13/23 1108 98.3 F (36.8 C)     Temp src --      SpO2 --      Weight --      Height --      Head Circumference --      Peak Flow --      Pain Score 10/13/23 1111 5     Pain Loc --      Pain Education --      Exclude from Growth Chart --    No data found.  Updated Vital Signs BP 131/86   Pulse 81   Temp 98.3 F (36.8 C)   Resp 18   Visual Acuity Right Eye Distance:   Left Eye Distance:   Bilateral Distance:    Right Eye Near:   Left Eye Near:    Bilateral Near:     Physical Exam Constitutional:      General: She is not in acute distress. HENT:     Right Ear: Tympanic membrane normal.     Left Ear: Tympanic membrane normal.     Nose: Congestion present.     Mouth/Throat:     Mouth: Mucous membranes are moist.     Pharynx: Oropharynx is clear.  Cardiovascular:     Rate and Rhythm: Normal rate and regular rhythm.     Heart sounds: Normal heart sounds.  Pulmonary:     Effort: Pulmonary effort is normal. No respiratory distress.     Breath sounds: Normal breath sounds.  Neurological:     Mental Status: She is alert.      UC Treatments / Results  Labs (all labs ordered are listed, but only abnormal results are displayed) Labs Reviewed - No data to display  EKG  Radiology No results found.  Procedures Procedures (including critical care  time)  Medications Ordered in UC Medications - No data to display  Initial Impression / Assessment and Plan / UC Course  I have reviewed the triage vital signs and the nursing notes.  Pertinent labs & imaging results that were available during my care of the patient were reviewed by me and considered in my medical decision making (see chart for details).    Acute sinusitis.  Patient has been symptomatic for 3 weeks.  Treating today with Augmentin.  Plain Mucinex, ibuprofen or Tylenol.  Education provided on sinus infection.  Instructed patient to follow up with her PCP if her symptoms are not improving.  She agrees to plan of care.   Final Clinical Impressions(s) / UC Diagnoses   Final diagnoses:  Acute non-recurrent maxillary sinusitis     Discharge Instructions      Take the Augmentin as directed.  Follow-up with your primary care provider if your symptoms are not improving.      ED Prescriptions     Medication Sig Dispense Auth. Provider   amoxicillin-clavulanate (AUGMENTIN) 875-125 MG tablet Take 1 tablet by mouth every 12 (twelve) hours. 14 tablet Mickie Bail, NP      PDMP not reviewed this encounter.   Mickie Bail, NP 10/13/23 [phone]

## 2023-10-13 NOTE — Discharge Instructions (Addendum)
Take the Augmentin as directed.  Follow up with your primary care provider if your symptoms are not improving.

## 2024-02-18 ENCOUNTER — Other Ambulatory Visit: Payer: Self-pay | Admitting: Family Medicine

## 2024-02-18 DIAGNOSIS — Z1231 Encounter for screening mammogram for malignant neoplasm of breast: Secondary | ICD-10-CM

## 2024-02-25 ENCOUNTER — Ambulatory Visit
Admission: RE | Admit: 2024-02-25 | Discharge: 2024-02-25 | Disposition: A | Discharge status: Home / Self Care | Source: Ambulatory Visit | Attending: Family Medicine | Admitting: Family Medicine

## 2024-02-25 DIAGNOSIS — Z1231 Encounter for screening mammogram for malignant neoplasm of breast: Secondary | ICD-10-CM | POA: Insufficient documentation

## 2024-03-02 NOTE — Progress Notes (Signed)
 PCP: [facility]   Chief Complaint  Patient presents with   Gynecologic Exam    No concerns    HPI:      Ms. Marcia Green is a 48 y.o. [id number] whose LMP was No LMP recorded. (Menstrual status: IUD)., presents today for her annual examination.  Her menses are absent due to IUD, occas BTB with mild dysmen, usually with stress. Hx of menometrorrhagia without BC. Having VS sx/mood changes/insomnia. PCP increased lexapro recently and pt started exercise.   Sex activity: single partner, contraception - IUD. Mirena placed about 10 yrs ago, would like another one. She does not have vaginal dryness/pain/bleeding.  Last Pap: 12/09/17 Results were: no abnormalities /neg HPV DNA. No hx of abn paps  Last mammogram: 02/25/24  Results were: normal--routine follow-up in 12 months; hx of RT breast calcifications on 6/24 bx There is no FH of breast cancer. There is no FH of ovarian cancer. The patient does do self-breast exams.  Colonoscopy: ~2021 at [facility] due to hemorrhoids;  Repeat due after 5 years per pt.   Tobacco use: The patient denies current or previous tobacco use. Alcohol use: none No drug use Exercise: moderately active  She does get adequate calcium but not Vitamin D  in her diet.  Labs with PCP.   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   Grade I hemorrhoids 05/03/2020   Vitamin D  deficiency 10/05/2015    Past Surgical History:  Procedure Laterality Date   BREAST BIOPSY Right 04/02/2023   stereo bx, calcs, COIL clip- negative   BREAST BIOPSY Right 04/02/2023   MM RT BREAST BX W LOC DEV 1ST LESION IMAGE BX SPEC STEREO GUIDE 04/02/2023 [facility]   TONSILLECTOMY     WISDOM TOOTH EXTRACTION      Family History  Problem Relation Age of Onset   Diabetes Brother    Congestive Heart Failure Father    Healthy Mother    Breast cancer  Neg Hx    Ovarian cancer Neg Hx    Colon cancer Neg Hx    Stomach cancer Neg Hx    Pancreatic cancer Neg Hx    Liver disease Neg Hx    Esophageal cancer Neg Hx     Social History   Socioeconomic History   Marital status: Married    Spouse name: Not on file   Number of children: Not on file   Years of education: Not on file   Highest education level: Not on file  Occupational History   Not on file  Tobacco Use   Smoking status: Never   Smokeless tobacco: Never  Vaping Use   Vaping status: Never Used  Substance and Sexual Activity   Alcohol use: Yes    Comment: occas   Drug use: No   Sexual activity: Yes    Birth control/protection: I.U.D.    Comment: Mirena  Other Topics Concern   Not on file  Social History Narrative   Not on file   Social Drivers of Health   Financial Resource Strain: Low Risk  (02/16/2024)   Received from [facility]   Overall Financial Resource Strain (CARDIA)    Difficulty of Paying Living Expenses: Not very hard  Food Insecurity: Unknown (02/16/2024)   Received from [facility]   Hunger Vital Sign    Worried About Running Out of Food in  the Last Year: Never true    Ran Out of Food in the Last Year: Patient declined  Transportation Needs: Patient Declined (02/16/2024)   Received from [facility] - Transportation    In the past 12 months, has lack of transportation kept you from medical appointments or from getting medications?: Patient declined    Lack of Transportation (Non-Medical): Patient declined  Physical Activity: Not on file  Stress: Not on file  Social Connections: Not on file  Intimate Partner Violence: Not on file     Current Outpatient Medications:    acetaminophen (TYLENOL) 500 MG tablet, Take by mouth., Disp: , Rfl:    Azelastine HCl 137 MCG/SPRAY SOLN, Place 1 spray into both nostrils 2 (two) times daily., Disp: , Rfl:    cetirizine (ZYRTEC) 10 MG tablet, Take 10  mg by mouth as needed. , Disp: , Rfl:    escitalopram (LEXAPRO) 20 MG tablet, Take 20 mg by mouth., Disp: , Rfl:    fluticasone (FLONASE) 50 MCG/ACT nasal spray, Place 1 spray into both nostrils as needed. , Disp: , Rfl:    hydrOXYzine (ATARAX) 25 MG tablet, Take by mouth., Disp: , Rfl:    levonorgestrel (MIRENA) 20 MCG/24HR IUD, 1 each by Intrauterine route once., Disp: , Rfl:    olopatadine (PATANOL) 0.1 % ophthalmic solution, Place 1 drop into both eyes as needed., Disp: , Rfl:    tretinoin (RETIN-A) 0.025 % cream, APPLY PEA SIZE AMOUNT TO DRY FACE EVERY EVENING. SLOWLY BUILDING UP TO NIGHTLY USE., Disp: , Rfl:      ROS:  Review of Systems  Constitutional:  Positive for fatigue. Negative for fever and unexpected weight change.  Respiratory:  Negative for cough, shortness of breath and wheezing.   Cardiovascular:  Negative for chest pain, palpitations and leg swelling.  Gastrointestinal:  Negative for blood in stool, constipation, diarrhea, nausea and vomiting.  Endocrine: Negative for cold intolerance, heat intolerance and polyuria.  Genitourinary:  Negative for dyspareunia, dysuria, flank pain, frequency, genital sores, hematuria, menstrual problem, pelvic pain, urgency, vaginal bleeding, vaginal discharge and vaginal pain.  Musculoskeletal:  Negative for back pain, joint swelling and myalgias.  Skin:  Negative for rash.  Neurological:  Negative for dizziness, syncope, light-headedness, numbness and headaches.  Hematological:  Negative for adenopathy.  Psychiatric/Behavioral:  Positive for agitation and sleep disturbance. Negative for confusion and suicidal ideas. The patient is not nervous/anxious.    BREAST: No symptoms    Objective: BP 118/74   Pulse 89   Ht 5\' 5"  (1.651 m)   Wt 176 lb (79.8 kg)   BMI 29.29 kg/m    Physical Exam Constitutional:      Appearance: She is well-developed.  Genitourinary:     Vulva normal.     Right Labia: No rash, tenderness or  lesions.    Left Labia: No tenderness, lesions or rash.    No vaginal discharge, erythema or tenderness.      Right Adnexa: not tender and no mass present.    Left Adnexa: not tender and no mass present.    No cervical friability or polyp.     IUD strings visualized.     Uterus is not enlarged or tender.  Breasts:    Right: No mass, nipple discharge, skin change or tenderness.     Left: No mass, nipple discharge, skin change or tenderness.  Neck:     Thyroid : No thyromegaly.  Cardiovascular:     Rate and Rhythm:  Normal rate and regular rhythm.     Heart sounds: Normal heart sounds. No murmur heard. Pulmonary:     Effort: Pulmonary effort is normal.     Breath sounds: Normal breath sounds.  Abdominal:     Palpations: Abdomen is soft.     Tenderness: There is no abdominal tenderness. There is no guarding or rebound.  Musculoskeletal:        General: Normal range of motion.     Cervical back: Normal range of motion.  Lymphadenopathy:     Cervical: No cervical adenopathy.  Neurological:     General: No focal deficit present.     Mental Status: She is alert and oriented to person, place, and time.     Cranial Nerves: No cranial nerve deficit.  Skin:    General: Skin is warm and dry.  Psychiatric:        Mood and Affect: Mood normal.        Behavior: Behavior normal.        Thought Content: Thought content normal.        Judgment: Judgment normal.  Vitals reviewed.     Assessment/Plan:  Encounter for annual routine gynecological examination  Cervical cancer screening - Plan: IGP, Aptima HPV  Screening for HPV (human papillomavirus) - Plan: IGP, Aptima HPV  Encounter for routine checking of intrauterine contraceptive device (IUD)--has 8 yr indication; pt has had it for 10 yrs. Would like another one. RTO for replacement. Condoms in meantime.   Encounter for screening mammogram for malignant neoplasm of breast; pt current on mammo  Vasomotor symptoms--pt has increased  lexapro. If sx persist and not tolerable, discussed veozah vs ERT.          GYN counsel breast self exam, mammography screening, menopause, adequate intake of calcium and vitamin D , diet and exercise    F/U  Return in about 1 year (around 03/03/2025).  Escarlet Saathoff B. Abdulkarim Eberlin, PA-C 03/03/2024 11:45 AM

## 2024-03-03 ENCOUNTER — Encounter: Payer: Self-pay | Admitting: Obstetrics and Gynecology

## 2024-03-03 ENCOUNTER — Ambulatory Visit (INDEPENDENT_AMBULATORY_CARE_PROVIDER_SITE_OTHER): Admitting: Obstetrics and Gynecology

## 2024-03-03 VITALS — BP 118/74 | HR 89 | Ht 65.0 in | Wt 176.0 lb

## 2024-03-03 DIAGNOSIS — Z01419 Encounter for gynecological examination (general) (routine) without abnormal findings: Secondary | ICD-10-CM | POA: Diagnosis not present

## 2024-03-03 DIAGNOSIS — Z1231 Encounter for screening mammogram for malignant neoplasm of breast: Secondary | ICD-10-CM

## 2024-03-03 DIAGNOSIS — Z1151 Encounter for screening for human papillomavirus (HPV): Secondary | ICD-10-CM

## 2024-03-03 DIAGNOSIS — Z30431 Encounter for routine checking of intrauterine contraceptive device: Secondary | ICD-10-CM

## 2024-03-03 DIAGNOSIS — Z124 Encounter for screening for malignant neoplasm of cervix: Secondary | ICD-10-CM

## 2024-03-03 NOTE — Patient Instructions (Signed)
 I value your feedback and you entrusting [place] with your care. If you get a [place] patient survey, I would appreciate you taking the time to let us know about your experience today. Thank you! ? ? ?

## 2024-03-09 LAB — IGP, APTIMA HPV: HPV Aptima: NEGATIVE

## 2024-04-07 ENCOUNTER — Encounter: Payer: Self-pay | Admitting: Obstetrics and Gynecology

## 2024-04-07 ENCOUNTER — Ambulatory Visit: Admitting: Obstetrics and Gynecology

## 2024-04-07 VITALS — BP 113/75 | HR 90 | Ht 65.0 in | Wt 175.0 lb

## 2024-04-07 DIAGNOSIS — Z30433 Encounter for removal and reinsertion of intrauterine contraceptive device: Secondary | ICD-10-CM | POA: Diagnosis not present

## 2024-04-07 DIAGNOSIS — Z3202 Encounter for pregnancy test, result negative: Secondary | ICD-10-CM

## 2024-04-07 LAB — POCT URINE PREGNANCY: Preg Test, Ur: NEGATIVE

## 2024-04-07 MED ORDER — LEVONORGESTREL 20 MCG/DAY IU IUD
1.0000 | INTRAUTERINE_SYSTEM | Freq: Once | INTRAUTERINE | Status: AC
  Administered 2024-04-07: 1 via INTRAUTERINE

## 2024-04-07 NOTE — Patient Instructions (Signed)
 I value your feedback and you entrusting [place] with your care. If you get a  patient survey, I would appreciate you taking the time to let us know about your experience today. Thank you!  Instructions after IUD insertion  Most women experience no significant problems after insertion of an IUD, however minor cramping and spotting for a few days is common. Cramps may be treated with ibuprofen 800mg  every 8 hours or Tylenol 650 mg every 4 hours. Contact [facility] immediately if you experience any of the following symptoms during the next week: temperature >99.6 degrees, worsening pelvic pain, abdominal pain, fainting, unusually heavy vaginal bleeding, foul vaginal discharge, or if you think you have expelled the IUD. Nothing inserted in the vagina for 48 hours. You will be scheduled for a follow up visit in approximately four weeks.

## 2024-04-07 NOTE — Progress Notes (Signed)
   Chief Complaint  Patient presents with   Contraception    Mirena replacement     History of Present Illness:  Marcia Green is a 48 y.o. that had a Mirena IUD placed approximately 10 years ago. Would like another one. Her menses are absent due to IUD, occas BTB with mild dysmen, usually with stress. Hx of menometrorrhagia without BC.    BP 113/75   Pulse 90   Ht 5' 5 (1.651 m)   Wt 175 lb (79.4 kg)   BMI 29.12 kg/m   Pelvic exam:  Two IUD strings present seen coming from the cervical os. EGBUS, vaginal vault and cervix: within normal limits  IUD Removal Strings of IUD identified and grasped.  IUD removed without problem with ring forceps.  Pt tolerated this well.  IUD noted to be intact.   IUD Insertion Procedure Note Patient identified, informed consent performed, consent signed.   Discussed risks of irregular bleeding, cramping, infection, malpositioning or misplacement of the IUD outside the uterus which may require further procedure such as laparoscopy, risk of failure <1%. Time out was performed.  Urine pregnancy test negative.  Speculum placed in the vagina.  Cervix visualized.  Cleaned with Betadine x 2.  Grasped anteriorly with a single tooth tenaculum.  Uterus sounded to 7.0 cm.   IUD placed per manufacturer's recommendations.  Strings trimmed to 3 cm. Tenaculum was removed, good hemostasis noted.  Patient tolerated procedure well.   Results for orders placed or performed in visit on 04/07/24 (from the past 24 hours)  POCT urine pregnancy     Status: Normal   Collection Time: 04/07/24  3:44 PM  Result Value Ref Range   Preg Test, Ur Negative Negative     ASSESSMENT:  Encounter for removal and reinsertion of intrauterine contraceptive device (IUD) - Plan: POCT urine pregnancy, levonorgestrel (MIRENA) 20 MCG/DAY IUD 1 each   Meds ordered this encounter  Medications   levonorgestrel (MIRENA) 20 MCG/DAY IUD 1 each     Plan:  Patient was given post-procedure  instructions.  She was advised to have backup contraception for one week.   Call if you are having increasing pain, cramps or bleeding or if you have a fever greater than 100.4 degrees F., shaking chills, nausea or vomiting. Patient was also asked to check IUD strings periodically and follow up in 4 weeks for IUD check.  Return in about 4 weeks (around 05/05/2024) for IUD f/u.  Imari Sivertsen B. Talah Cookston, PA-C 04/07/2024 4:32 PM

## 2024-05-05 NOTE — Progress Notes (Unsigned)
   No chief complaint on file.    History of Present Illness:  Marcia Green is a 48 y.o. that had a Mirena  IUD placed approximately 1 month ago. Since that time, she denies dyspareunia, pelvic pain, non-menstrual bleeding, vaginal d/c, heavy bleeding.   Review of Systems  Physical Exam:  There were no vitals taken for this visit. There is no height or weight on file to calculate BMI.  Pelvic exam:  Two IUD strings {DESC; PRESENT/ABSENT:17923} seen coming from the cervical os. EGBUS, vaginal vault and cervix: within normal limits   Assessment:   No diagnosis found.  IUD strings present in proper location; pt doing well  Plan: F/u if any signs of infection or can no longer feel the strings.   Drena Ham B. Taquana Bartley, PA-C 05/05/2024 4:33 PM

## 2024-05-07 ENCOUNTER — Encounter: Payer: Self-pay | Admitting: Obstetrics and Gynecology

## 2024-05-07 ENCOUNTER — Ambulatory Visit: Admitting: Obstetrics and Gynecology

## 2024-05-07 VITALS — BP 112/75 | HR 92 | Ht 65.0 in | Wt 178.0 lb

## 2024-05-07 DIAGNOSIS — Z30431 Encounter for routine checking of intrauterine contraceptive device: Secondary | ICD-10-CM
